# Patient Record
Sex: Female | Born: 1995 | Race: White | Hispanic: No | Marital: Single | Smoking: Never smoker
Health system: Southern US, Community
[De-identification: ages and names within clinical notes are randomized; demographics above are authoritative.]

## PROBLEM LIST (undated history)

## (undated) DIAGNOSIS — D649 Anemia, unspecified: Secondary | ICD-10-CM

## (undated) DIAGNOSIS — O24419 Gestational diabetes mellitus in pregnancy, unspecified control: Secondary | ICD-10-CM

## (undated) HISTORY — DX: Anemia, unspecified: D64.9

## (undated) HISTORY — PX: MOUTH SURGERY: SHX715

## (undated) HISTORY — DX: Gestational diabetes mellitus in pregnancy, unspecified control: O24.419

---
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
## 2017-01-03 ENCOUNTER — Emergency Department (INDEPENDENT_AMBULATORY_CARE_PROVIDER_SITE_OTHER)
Admission: EM | Admit: 2017-01-03 | Discharge: 2017-01-03 | Disposition: A | Discharge status: Home / Self Care | Payer: Medicaid Other | Source: Home / Self Care | Attending: Family Medicine | Admitting: Family Medicine

## 2017-01-03 ENCOUNTER — Encounter: Payer: Self-pay | Admitting: Emergency Medicine

## 2017-01-03 DIAGNOSIS — J029 Acute pharyngitis, unspecified: Secondary | ICD-10-CM | POA: Diagnosis not present

## 2017-01-03 LAB — POCT RAPID STREP A (OFFICE): Rapid Strep A Screen: NEGATIVE

## 2017-01-03 LAB — POCT MONO SCREEN (KUC): Mono, POC: NEGATIVE

## 2017-01-03 MED ORDER — PENICILLIN V POTASSIUM 500 MG PO TABS: ORAL_TABLET | ORAL | 0 refills | Status: DC

## 2017-01-03 NOTE — Discharge Instructions (Signed)
Try warm salt water gargles for sore throat.  ?May take Ibuprofen 200mg, 4 tabs every 8 hours with food.  ?

## 2017-01-03 NOTE — ED Provider Notes (Signed)
[facility]    CSN: [id number] Arrival date & time: 01/03/17  1730     History   Chief Complaint Chief Complaint  Patient presents with  . Sore Throat    HPI Lauren Patel is a 21 y.o. female.   Patient complains of a sore throat for about 5 days without other symptoms.   The history is provided by the patient.    History reviewed. No pertinent past medical history.  There are no active problems to display for this patient.   History reviewed. No pertinent surgical history.  OB History    No data available       Home Medications    Prior to Admission medications   Medication Sig Start Date End Date Taking? Authorizing Provider  Multiple Vitamin (MULTIVITAMIN) tablet Take 1 tablet by mouth daily.    [provider]  penicillin v potassium (VEETID) 500 MG tablet Take one tab by mouth twice daily for 10 days 01/03/17   [name], MD    Family History History reviewed. No pertinent family history.  Social History Social History  Substance Use Topics  . Smoking status: Passive Smoke Exposure - Never Smoker  . Smokeless tobacco: Never Used  . Alcohol use No     Allergies   Patient has no known allergies.   Review of Systems Review of Systems + sore throat + occasional cough No pleuritic pain No wheezing No nasal congestion No post-nasal drainage No sinus pain/pressure No itchy/red eyes No earache No hemoptysis No SOB No fever/chills No nausea No vomiting No abdominal pain No diarrhea No urinary symptoms No skin rash + fatigue No myalgias No headache    Physical Exam Triage Vital Signs ED Triage Vitals  Enc Vitals Group     BP 01/03/17 1748 103/70     Pulse Rate 01/03/17 1748 86     Resp --      Temp 01/03/17 1748 98.2 F (36.8 C)     Temp Source 01/03/17 1748 Oral     SpO2 01/03/17 1748 100 %     Weight 01/03/17 1748 148 lb (67.1 kg)     Height --      Head Circumference --      Peak Flow  --      Pain Score 01/03/17 1749 6     Pain Loc --      Pain Edu? --      Excl. in GC? --    No data found.   Updated Vital Signs BP 103/70 (BP Location: Right Arm)   Pulse 86   Temp 98.2 F (36.8 C) (Oral)   Wt 148 lb (67.1 kg)   SpO2 100%   Visual Acuity Right Eye Distance:   Left Eye Distance:   Bilateral Distance:    Right Eye Near:   Left Eye Near:    Bilateral Near:     Physical Exam Nursing notes and Vital Signs reviewed. Appearance:  Patient appears stated age, and in no acute distress Eyes:  Pupils are equal, round, and reactive to light and accomodation.  Extraocular movement is intact.  Conjunctivae are not inflamed  Ears:  Canals normal.  Tympanic membranes normal.  Nose:  Mildly congested turbinates.  No sinus tenderness.   Pharynx:  Erythematous and slightly swollen without obstruction.  Bilateral exudate present. Neck:  Supple.  Enlarged posterior/lateral nodes are palpated bilaterally, tender to palpation on the left.   Lungs:  Clear to auscultation.  Breath  sounds are equal.  Moving air well. Heart:  Regular rate and rhythm without murmurs, rubs, or gallops.  Abdomen:   Mild tenderness over spleen and liver without masses or hepatosplenomegaly.  Bowel sounds are present.  No CVA or flank tenderness.  Extremities:  No edema.  Skin:  No rash present.    UC Treatments / Results  Labs (all labs ordered are listed, but only abnormal results are displayed) Labs Reviewed  STREP A DNA PROBE  POCT RAPID STREP A (OFFICE) negative  POCT MONO SCREEN ([facility]) negative    EKG  EKG Interpretation None       Radiology No results found.  Procedures Procedures (including critical care time)  Medications Ordered in UC Medications - No data to display   Initial Impression / Assessment and Plan / UC Course  I have reviewed the triage vital signs and the nursing notes.  Pertinent labs & imaging results that were available during my care of the patient were  reviewed by me and considered in my medical decision making (see chart for details).    ?false negative strep. Throat culture pending.  Begin empiric PenVK Try warm salt water gargles for sore throat.  May take Ibuprofen 200mg , 4 tabs every 8 hours with food.  Followup with Family Doctor if not improved in 10 days.    Final Clinical Impressions(s) / UC Diagnoses   Final diagnoses:  Pharyngitis, unspecified etiology    New Prescriptions New Prescriptions   PENICILLIN V POTASSIUM (VEETID) 500 MG TABLET    Take one tab by mouth twice daily for 10 days         [name], [name], MD 01/10/17 1415

## 2017-01-03 NOTE — ED Triage Notes (Signed)
Pt c/o sore throat x4 days. Denies fever or other s/s. She has not taken any meds for this/

## 2017-01-05 ENCOUNTER — Telehealth: Payer: Self-pay | Admitting: Emergency Medicine

## 2017-01-05 LAB — STREP A DNA PROBE: GASP: NOT DETECTED

## 2017-01-05 NOTE — Telephone Encounter (Signed)
Unable to leave a message, strep culture is neg

## 2017-03-05 ENCOUNTER — Encounter: Payer: Self-pay | Admitting: *Deleted

## 2017-03-05 ENCOUNTER — Emergency Department (INDEPENDENT_AMBULATORY_CARE_PROVIDER_SITE_OTHER): Payer: Medicaid Other

## 2017-03-05 ENCOUNTER — Emergency Department (INDEPENDENT_AMBULATORY_CARE_PROVIDER_SITE_OTHER)
Admission: EM | Admit: 2017-03-05 | Discharge: 2017-03-05 | Disposition: A | Discharge status: Home / Self Care | Payer: Medicaid Other | Source: Home / Self Care | Attending: Family Medicine | Admitting: Family Medicine

## 2017-03-05 DIAGNOSIS — M549 Dorsalgia, unspecified: Secondary | ICD-10-CM

## 2017-03-05 DIAGNOSIS — R0602 Shortness of breath: Secondary | ICD-10-CM

## 2017-03-05 DIAGNOSIS — R42 Dizziness and giddiness: Secondary | ICD-10-CM

## 2017-03-05 DIAGNOSIS — R51 Headache: Secondary | ICD-10-CM

## 2017-03-05 DIAGNOSIS — R110 Nausea: Secondary | ICD-10-CM

## 2017-03-05 DIAGNOSIS — R519 Headache, unspecified: Secondary | ICD-10-CM

## 2017-03-05 DIAGNOSIS — R1011 Right upper quadrant pain: Secondary | ICD-10-CM

## 2017-03-05 LAB — POCT CBC W AUTO DIFF (K'VILLE URGENT CARE)

## 2017-03-05 MED ORDER — CYCLOBENZAPRINE HCL 5 MG PO TABS: 5.0000 mg | ORAL_TABLET | Freq: Three times a day (TID) | ORAL | 0 refills | Status: DC | PRN

## 2017-03-05 MED ORDER — ONDANSETRON HCL 4 MG PO TABS: 4.0000 mg | ORAL_TABLET | Freq: Four times a day (QID) | ORAL | 0 refills | Status: DC

## 2017-03-05 NOTE — Discharge Instructions (Signed)
°  You may take 500mg  acetaminophen every 4-6 hours or in combination with ibuprofen 400-600mg  every 6-8 hours as needed for pain, inflammation, and fever.  Be sure to drink at least eight 8oz glasses of water to stay well hydrated and get at least 8 hours of sleep at night, preferably more while sick.   Flexeril (cyclobenzaprine) is a muscle relaxer and may cause drowsiness. Do not drink alcohol, drive, or operate heavy machinery while taking.

## 2017-03-05 NOTE — ED Triage Notes (Signed)
Pt c/o upper abd pain, dizziness, chills, HA and arms feel achy x 3 days. Denies fever, cough or recent URI. Today she reports mid back pain and SOB.

## 2017-03-05 NOTE — ED Notes (Signed)
Blood drawn from the LT antecubital. Attempted 1 stick. Pt tolerated procedure well. Krikor Willet, LPN   

## 2017-03-05 NOTE — ED Provider Notes (Signed)
[facility]    CSN: [id number] Arrival date & time: 03/05/17  1449     History   Chief Complaint Chief Complaint  Patient presents with  . Dizziness  . Generalized Body Aches    HPI Lauren Patel is a 21 y.o. female.   HPI Lauren Patel is a 21 y.o. female presenting to UC with c/o 3 days of intermittent upper abdominal pain, dizziness, chills, generalized HA, aching arms and back.  Today she developed mid back pain and mild SOB.  She has been trying to take warm baths in the evening when back pain and HA seem to get worse, this provides moderate relief.  She has also taken ibuprofen with some relief.  She has had minimal nausea but no vomiting or diarrhea. Denies chest pain, fever, cough, congestion, leg pain or swelling. Denies sick contacts.  She has been able to eat normally and states she did feel somewhat better after eating today. LMP: 02/16/17   History reviewed. No pertinent past medical history.  There are no active problems to display for this patient.   History reviewed. No pertinent surgical history.  OB History    No data available       Home Medications    Prior to Admission medications   Medication Sig Start Date End Date Taking? Authorizing Provider  cyclobenzaprine (FLEXERIL) 5 MG tablet Take 1-2 tablets (5-10 mg total) by mouth 3 (three) times daily as needed for muscle spasms. 03/05/17   Lurene Shadow, PA-C  Multiple Vitamin (MULTIVITAMIN) tablet Take 1 tablet by mouth daily.    [provider]  ondansetron (ZOFRAN) 4 MG tablet Take 1 tablet (4 mg total) by mouth every 6 (six) hours. 03/05/17   Lurene Shadow, PA-C    Family History History reviewed. No pertinent family history.  Social History Social History  Substance Use Topics  . Smoking status: Passive Smoke Exposure - Never Smoker  . Smokeless tobacco: Never Used  . Alcohol use No     Allergies   Patient has no known allergies.   Review of  Systems Review of Systems  Constitutional: Positive for chills. Negative for appetite change, fatigue and fever.  HENT: Negative for congestion, ear pain, hearing loss and sore throat.   Eyes: Negative for photophobia and pain.  Respiratory: Negative for cough and shortness of breath.   Cardiovascular: Negative for chest pain and palpitations.  Gastrointestinal: Positive for abdominal pain (upper) and nausea. Negative for diarrhea and vomiting.  Genitourinary: Negative for dysuria, frequency, hematuria and urgency.  Musculoskeletal: Positive for back pain and myalgias. Negative for arthralgias, neck pain and neck stiffness.  Skin: Negative for rash.  Neurological: Positive for dizziness and headaches. Negative for syncope, weakness, light-headedness and numbness.     Physical Exam Triage Vital Signs ED Triage Vitals [03/05/17 1514]  Enc Vitals Group     BP 119/78     Pulse Rate 86     Resp 16     Temp 98 F (36.7 C)     Temp Source Oral     SpO2 100 %     Weight 147 lb (66.7 kg)     Height 5\' 7"  (1.702 m)     Head Circumference      Peak Flow      Pain Score 0     Pain Loc      Pain Edu?      Excl. in GC?    Orthostatic VS for  the past 24 hrs:  BP- Lying Pulse- Lying BP- Sitting Pulse- Sitting BP- Standing at 0 minutes Pulse- Standing at 0 minutes  03/05/17 1602 107/73 78 106/71 85 118/80 90    Updated Vital Signs BP 119/78 (BP Location: Left Arm)   Pulse 86   Temp 98 F (36.7 C) (Oral)   Resp 16   Ht 5\' 7"  (1.702 m)   Wt 147 lb (66.7 kg)   LMP 02/16/2017   SpO2 100%   BMI 23.02 kg/m     Physical Exam  Constitutional: She is oriented to person, place, and time. She appears well-developed and well-nourished. No distress.  HENT:  Head: Normocephalic and atraumatic.  Right Ear: Tympanic membrane normal.  Left Ear: Tympanic membrane normal.  Nose: Nose normal.  Mouth/Throat: Uvula is midline, oropharynx is clear and moist and mucous membranes are normal.    Eyes: Pupils are equal, round, and reactive to light. Conjunctivae and EOM are normal. Right eye exhibits no discharge. Left eye exhibits no discharge.  Neck: Normal range of motion. Neck supple.  Cardiovascular: Normal rate and regular rhythm.   Pulmonary/Chest: Effort normal and breath sounds normal. No respiratory distress. She has no wheezes. She has no rales.  Abdominal: Soft. Bowel sounds are normal. She exhibits no distension and no mass. There is tenderness. There is no rebound, no guarding and no CVA tenderness.  Mild tenderness across upper abdomen. No focal tenderness. Soft, non-distended.   Musculoskeletal: Normal range of motion. She exhibits no edema or tenderness.  Neurological: She is alert and oriented to person, place, and time. No cranial nerve deficit.  CN II-XII in tact. Speech is clear, alert to person place and time. Normal gait.   Skin: Skin is warm and dry. She is not diaphoretic.  Psychiatric: She has a normal mood and affect. Her behavior is normal.  Nursing note and vitals reviewed.    UC Treatments / Results  Labs (all labs ordered are listed, but only abnormal results are displayed) Labs Reviewed  COMPLETE METABOLIC PANEL WITH GFR  POCT CBC W AUTO DIFF ([facility])    EKG  EKG Interpretation None       Radiology Dg Chest 2 View  Result Date: 03/05/2017 CLINICAL DATA:  21 year old female with shortness of breath for 4 days. EXAM: CHEST  2 VIEW COMPARISON:  None. FINDINGS: Normal lung volumes. Normal cardiac size and mediastinal contours. Visualized tracheal air column is within normal limits. The lungs are clear. No pneumothorax or pleural effusion. No osseous abnormality identified. Negative visible bowel gas pattern. IMPRESSION: Negative.  No acute cardiopulmonary abnormality. Electronically Signed   By: Odessa FlemingH  Hall M.D.   On: 03/05/2017 15:51    Procedures Procedures (including critical care time)  Medications Ordered in UC Medications -  No data to display   Initial Impression / Assessment and Plan / UC Course  I have reviewed the triage vital signs and the nursing notes.  Pertinent labs & imaging results that were available during my care of the patient were reviewed by me and considered in my medical decision making (see chart for details).     Pt presenting with multiple vague complaints. Pt overall appears well and vitals: WNL  CBC: WNL CMP: Pending Pt denied concern for pregnancy. LMP: 02/16/17 Question if back pain and HA related to muscle spasms/cramps. May try flexeril. Discussed possibility of fatigue when taking. Take at night. No alcohol or driving while taking. Abdominal pain- gallbladder? CMP pending  Doubt PE-  O2 Sat 100% on RA. No evidence of respiratory distress.   Encouraged fluids and rest. May have acetaminophen and ibuprofen for pain F/u with PCP in 2-3 days if not improving Discussed symptoms that warrant emergent care in the ED.   Final Clinical Impressions(s) / UC Diagnoses   Final diagnoses:  Dizziness  Nausea without vomiting  Abdominal pain, right upper quadrant  Mid back pain on right side  Generalized headache  Shortness of breath    New Prescriptions Discharge Medication List as of 03/05/2017  3:59 PM    START taking these medications   Details  cyclobenzaprine (FLEXERIL) 5 MG tablet Take 1-2 tablets (5-10 mg total) by mouth 3 (three) times daily as needed for muscle spasms., Starting Fri 03/05/2017, Normal         Controlled Substance Prescriptions [place] Controlled Substance Registry consulted? Not Applicable   Rolla Plate 03/05/17 1652

## 2017-03-06 ENCOUNTER — Telehealth: Payer: Self-pay | Admitting: Emergency Medicine

## 2017-03-06 LAB — COMPLETE METABOLIC PANEL WITH GFR
AG Ratio: 1.5 (calc) (ref 1.0–2.5)
ALT: 7 U/L (ref 5–32)
AST: 17 U/L (ref 12–32)
Albumin: 4.1 g/dL (ref 3.6–5.1)
Alkaline phosphatase (APISO): 62 U/L (ref 47–176)
BUN: 12 mg/dL (ref 7–20)
CO2: 27 mmol/L (ref 20–32)
Calcium: 8.9 mg/dL (ref 8.9–10.4)
Chloride: 102 mmol/L (ref 98–110)
Creat: 0.82 mg/dL (ref 0.50–1.00)
GFR, Est African American: 120 mL/min/{1.73_m2} (ref >=60)
GFR, Est Non African American: 104 mL/min/{1.73_m2} (ref >=60)
Globulin: 2.8 g/dL (calc) (ref 2.0–3.8)
Glucose, Bld: 96 mg/dL (ref 65–99)
Potassium: 4 mmol/L (ref 3.8–5.1)
Sodium: 138 mmol/L (ref 135–146)
Total Bilirubin: 0.3 mg/dL (ref 0.2–1.1)
Total Protein: 6.9 g/dL (ref 6.3–8.2)

## 2017-05-19 ENCOUNTER — Other Ambulatory Visit: Payer: Self-pay

## 2017-05-19 ENCOUNTER — Encounter (HOSPITAL_BASED_OUTPATIENT_CLINIC_OR_DEPARTMENT_OTHER): Payer: Self-pay

## 2017-05-19 ENCOUNTER — Emergency Department (HOSPITAL_BASED_OUTPATIENT_CLINIC_OR_DEPARTMENT_OTHER)
Admission: EM | Admit: 2017-05-19 | Discharge: 2017-05-19 | Disposition: A | Discharge status: Home / Self Care | Payer: Self-pay | Attending: Emergency Medicine | Admitting: Emergency Medicine

## 2017-05-19 DIAGNOSIS — Z79899 Other long term (current) drug therapy: Secondary | ICD-10-CM | POA: Insufficient documentation

## 2017-05-19 DIAGNOSIS — Z7722 Contact with and (suspected) exposure to environmental tobacco smoke (acute) (chronic): Secondary | ICD-10-CM | POA: Insufficient documentation

## 2017-05-19 DIAGNOSIS — R040 Epistaxis: Secondary | ICD-10-CM | POA: Insufficient documentation

## 2017-05-19 MED ORDER — OXYMETAZOLINE HCL 0.05 % NA SOLN
1.0000 | Freq: Once | NASAL | Status: AC
  Administered 2017-05-19: 1 via NASAL
  Filled 2017-05-19: qty 15

## 2017-05-19 NOTE — ED Provider Notes (Signed)
[facility] EMERGENCY DEPARTMENT Provider Note   CSN: [id number] Arrival date & time: 05/19/17  1530     History   Chief Complaint Chief Complaint  Patient presents with  . Epistaxis    HPI Lauren Patel is a 22 y.o. female.  The history is provided by the patient and medical records. No language interpreter was used.  Epistaxis     Lauren Patel is an otherwise healthy 22 y.o. female who presents to the Emergency Department complaining of nosebleed which began around 45 minutes ago. Bleeding was just from the left nare. Lasted for about 45 minutes and has now resolved. No history of similar. Has had nasal congestion, but has not felt ill. She has had a headache and right-sided neck pain for the last two days, but this morning, her headache had actually resolved. She took ibuprofen which helped. Hx of similar headaches which typically do improve in a day or two after ibuprofen. No fever, chills, weakness, numbness, visual changes. Neck does not feel stiff.   History reviewed. No pertinent past medical history.  There are no active problems to display for this patient.   History reviewed. No pertinent surgical history.  OB History    No data available       Home Medications    Prior to Admission medications   Medication Sig Start Date End Date Taking? Authorizing Provider  cyclobenzaprine (FLEXERIL) 5 MG tablet Take 1-2 tablets (5-10 mg total) by mouth 3 (three) times daily as needed for muscle spasms. 03/05/17  Yes Phelps, Vangie Bicker, PA-C  Multiple Vitamin (MULTIVITAMIN) tablet Take 1 tablet by mouth daily.    [provider]  ondansetron (ZOFRAN) 4 MG tablet Take 1 tablet (4 mg total) by mouth every 6 (six) hours. 03/05/17   Lurene Shadow, PA-C    Family History No family history on file.  Social History Social History   Tobacco Use  . Smoking status: Passive Smoke Exposure - Never Smoker  . Smokeless tobacco: Never Used    Substance Use Topics  . Alcohol use: No  . Drug use: No     Allergies   Patient has no known allergies.   Review of Systems Review of Systems  HENT: Positive for nosebleeds.   Musculoskeletal: Positive for neck pain. Negative for back pain.  Neurological: Positive for headaches (Resolved). Negative for dizziness, weakness and numbness.     Physical Exam Updated Vital Signs BP 115/76 (BP Location: Right Arm)   Pulse (!) 57   Temp 98.4 F (36.9 C) (Oral)   Resp 18   Ht 5\' 7"  (1.702 m)   Wt 63.5 kg (140 lb)   LMP 05/16/2017   SpO2 100%   BMI 21.93 kg/m   Physical Exam  Constitutional: She is oriented to person, place, and time. She appears well-developed and well-nourished. No distress.  HENT:  Head: Normocephalic and atraumatic.  Right nare normal. Dried blood in anterior left nare.  Neck:  Full ROM without pain.  No meningeal signs.  No midline tenderness, but does have tenderness to the right paraspinal musculature.  Cardiovascular: Normal rate, regular rhythm and normal heart sounds.  No murmur heard. Pulmonary/Chest: Effort normal and breath sounds normal. No respiratory distress.  Abdominal: Soft. She exhibits no distension. There is no tenderness.  Musculoskeletal: Normal range of motion.  Neurological: She is alert and oriented to person, place, and time.  Speech clear and goal oriented. CN 2-12 grossly intact. Normal finger-to-nose and rapid  alternating movements. No drift. Strength and sensation intact. Steady gait.   Skin: Skin is warm and dry.  Nursing note and vitals reviewed.    ED Treatments / Results  Labs (all labs ordered are listed, but only abnormal results are displayed) Labs Reviewed - No data to display  EKG  EKG Interpretation None       Radiology No results found.  Procedures Procedures (including critical care time)  Medications Ordered in ED Medications  oxymetazoline (AFRIN) 0.05 % nasal spray 1 spray (1 spray Each Nare  Given 05/19/17 1718)     Initial Impression / Assessment and Plan / ED Course  I have reviewed the triage vital signs and the nursing notes.  Pertinent labs & imaging results that were available during my care of the patient were reviewed by me and considered in my medical decision making (see chart for details).    Lauren Patel is an otherwise healthy 22 y.o. female who presents to ED for nosebleed which began today, lasting 45 minutes, and has now resolved. No active bleeding on exam, but does have dried blood in the nare. Afrin given. BP 115/76. Has had a headache over the last 2 days, but no headache today. No focal neuro deficits. Hx of similar headaches. Some right-sided neck pain which appears musk on exam. No fevers. No meningeal signs on exam.   Evaluation does not show pathology that would require ongoing emergent intervention or inpatient treatment.  Symptomatic home care instructions discussed.  Reasons to return to ER discussed.  All questions answered.  Final Clinical Impressions(s) / ED Diagnoses   Final diagnoses:  Epistaxis    ED Discharge Orders    None       Kieron Kantner, Chase PicketJaime Pilcher, PA-C 05/19/17 1728    Loren RacerYelverton, David, MD 05/21/17 [phone]

## 2017-05-19 NOTE — ED Triage Notes (Addendum)
Pt c/o headache for two days that has resolved, muscular neck pain that started yesterday and a nosebleed for 45 minutes today, pt has not taken any meds for symptoms today

## 2017-05-19 NOTE — ED Notes (Signed)
Nurse first-pt with tissue in left nostril-no nose bleed through at this time-NAD

## 2017-05-19 NOTE — Discharge Instructions (Signed)
It was my pleasure taking care of you today!   Use afrin tonight and in the morning to prevent nose from starting to bleed again.  If your nose starts bleeding again, hold direct pressure as we discussed for 20 minutes without stopping. If nose continues to bleed, return to ER.   Ibuprofen as needed every 8 hours for headache / neck pain. You can take up to 600mg  each dose (3 pills up to 3 times a day). Heat or ice to the neck can help with pain relief as well. Stay hydrated!  Fortunately, your evaluation today is reassuring with no apparent emergent cause for your headache at this time. With that being said, it is VERY important that you monitor your symptoms at home. If you develop worsening headache, new fever, rash, weakness, numbness, trouble with your speech, trouble walking, new or worsening symptoms or any concerning symptoms, please return to the ED immediately.

## 2018-02-21 ENCOUNTER — Ambulatory Visit (INDEPENDENT_AMBULATORY_CARE_PROVIDER_SITE_OTHER): Payer: Self-pay | Admitting: *Deleted

## 2018-02-21 ENCOUNTER — Encounter: Payer: Self-pay | Admitting: Obstetrics & Gynecology

## 2018-02-21 ENCOUNTER — Telehealth: Payer: Self-pay | Admitting: *Deleted

## 2018-02-21 ENCOUNTER — Encounter: Payer: Self-pay | Admitting: *Deleted

## 2018-02-21 VITALS — BP 103/68 | HR 92 | Wt 135.6 lb

## 2018-02-21 DIAGNOSIS — Z3A10 10 weeks gestation of pregnancy: Secondary | ICD-10-CM

## 2018-02-21 DIAGNOSIS — Z3201 Encounter for pregnancy test, result positive: Secondary | ICD-10-CM

## 2018-02-21 LAB — POCT PREGNANCY, URINE: Preg Test, Ur: POSITIVE — AB

## 2018-02-21 NOTE — Progress Notes (Signed)
Pt here to confirm pregnancy. Denies any pain or bleeding. Some nausea in am and tired. Encouraged to eat small frequent meals. May try OTC B6 and Unisom for nausea. To make appt with provider of choice to begin [facility]. Pt voices understanding and agrees with plan

## 2018-02-21 NOTE — Telephone Encounter (Signed)
Called patient to schedule New OB appt. But no answer and mailbox is full.

## 2018-02-21 NOTE — Progress Notes (Signed)
I have reviewed the chart and agree with nursing staff's documentation of this patient's encounter.  Thressa Sheller, CNM 02/21/2018 3:49 PM

## 2018-05-02 ENCOUNTER — Encounter: Payer: Self-pay | Admitting: Obstetrics & Gynecology

## 2018-05-04 NOTE — L&D Delivery Note (Signed)
OB/GYN Faculty Practice Delivery Note  Lauren Patel is a 23 y.o. G2P1001 s/p SVD at [redacted]w[redacted]d. She was admitted for spontaneous onset of labor.   ROM: 5h 40m with clear fluid GBS Status: negative Maximum Maternal Temperature: Temp (24hrs), Avg:98.5 F (36.9 C), Min:98.4 F (36.9 C), Max:98.5 F (36.9 C)  Labor Progress: . Admitted in active labor . Epidural placement . SROM  . Progressed to complete  Delivery Date/Time: 09/17/18 at 0156 Delivery: Called to room and patient was complete and pushing. Head delivered LOA. No nuchal cord present. Shoulder and body delivered in usual fashion. Infant with spontaneous cry, placed on mother's abdomen, dried and stimulated. Cord clamped x 2 after 1-minute delay, and cut by father of baby. Cord blood drawn. Placenta delivered spontaneously with gentle cord traction. Fundus firm with massage and Pitocin. Labia, perineum, vagina, and cervix inspected inspected with no lacerations.   Placenta: spontaneous, intact, 3-vessel cord (to be discarded) Complications: none immediate Lacerations: none EBL: 200, Triton value pending in delivery summary Analgesia: epidural  Postpartum Planning [x]  message to sent to schedule follow-up  [x]  vaccines UTD  Infant: Vigorous female  APGARs 8, 9  weight pending but appears AGA  Laurel S. Earlene Plater, DO OB/GYN Fellow, Faculty Practice

## 2018-05-18 ENCOUNTER — Other Ambulatory Visit (HOSPITAL_COMMUNITY)
Admission: RE | Admit: 2018-05-18 | Discharge: 2018-05-18 | Disposition: A | Discharge status: Home / Self Care | Payer: Medicaid Other | Source: Ambulatory Visit | Attending: Obstetrics and Gynecology | Admitting: Obstetrics and Gynecology

## 2018-05-18 ENCOUNTER — Ambulatory Visit (INDEPENDENT_AMBULATORY_CARE_PROVIDER_SITE_OTHER): Payer: Medicaid Other | Admitting: Obstetrics and Gynecology

## 2018-05-18 ENCOUNTER — Encounter: Payer: Self-pay | Admitting: Obstetrics and Gynecology

## 2018-05-18 VITALS — BP 98/59 | HR 77 | Wt 145.0 lb

## 2018-05-18 DIAGNOSIS — Z23 Encounter for immunization: Secondary | ICD-10-CM | POA: Diagnosis not present

## 2018-05-18 DIAGNOSIS — Z3482 Encounter for supervision of other normal pregnancy, second trimester: Secondary | ICD-10-CM | POA: Diagnosis not present

## 2018-05-18 DIAGNOSIS — O0930 Supervision of pregnancy with insufficient antenatal care, unspecified trimester: Secondary | ICD-10-CM

## 2018-05-18 DIAGNOSIS — O0932 Supervision of pregnancy with insufficient antenatal care, second trimester: Secondary | ICD-10-CM

## 2018-05-18 DIAGNOSIS — Z8632 Personal history of gestational diabetes: Secondary | ICD-10-CM

## 2018-05-18 DIAGNOSIS — Z3480 Encounter for supervision of other normal pregnancy, unspecified trimester: Secondary | ICD-10-CM

## 2018-05-18 MED ORDER — ASPIRIN EC 81 MG PO TBEC: 81.0000 mg | DELAYED_RELEASE_TABLET | Freq: Every day | ORAL | 1 refills | Status: DC

## 2018-05-18 NOTE — Patient Instructions (Signed)

## 2018-05-18 NOTE — Progress Notes (Signed)
Subjective:   Lauren Patel is a 23 y.o. G2P1000 at [redacted]w[redacted]d by LMP being seen today for her first obstetrical visit.  Her obstetrical history is significant for gestational DM , late to care, traumatic birth experience with previous delivery. PP hemorrhage requiring blood transfusion and vacuum delivery.  Patient does not intend to breast feed. Pregnancy history fully reviewed. Unplanned pregnancy however happy. Significant other present today for visit.   Patient reports no complaints.  HISTORY: OB History  Gravida Para Term Preterm AB Living  2 1 1  0 0 0  SAB TAB Ectopic Multiple Live Births  0 0 0 0 1    # Outcome Date GA Lbr Len/2nd Weight Sex Delivery Anes PTL Lv  2 Current           1 Term 05/06/16     Vag-Spont       Last pap smear was done NA, age 39.   Past Medical History:  Diagnosis Date  . Anemia   . Gestational diabetes    Past Surgical History:  Procedure Laterality Date  . MOUTH SURGERY     Family History  Problem Relation Age of Onset  . Asthma Mother   . Diabetes Maternal Grandmother   . Breast cancer Maternal Grandmother   . Hypertension Maternal Grandmother   . Asthma Maternal Grandmother   . Breast cancer Paternal Grandmother    Social History   Tobacco Use  . Smoking status: Passive Smoke Exposure - Never Smoker  . Smokeless tobacco: Never Used  Substance Use Topics  . Alcohol use: No  . Drug use: No   No Known Allergies Current Outpatient Medications on File Prior to Visit  Medication Sig Dispense Refill  . ondansetron (ZOFRAN) 4 MG tablet Take 1 tablet (4 mg total) by mouth every 6 (six) hours. (Patient not taking: Reported on 02/21/2018) 12 tablet 0   No current facility-administered medications on file prior to visit.     Review of Systems Pertinent items noted in HPI and remainder of comprehensive ROS otherwise negative.  Exam   Vitals:   05/18/18 1404  BP: (!) 98/59  Pulse: 77  Weight: 145 lb (65.8 kg)     BP  (!) 98/59   Pulse 77   Wt 145 lb (65.8 kg)   LMP 12/11/2017   BMI 22.71 kg/m  Uterine Size: size equals dates  Pelvic Exam: Deferred    Skin: normal coloration and turgor, no rashes    Neurologic: negative   Extremities: normal strength, tone, and muscle mass   HEENT neck supple with midline trachea and thyroid without masses   Mouth/Teeth mucous membranes moist, pharynx normal without lesions   Neck supple and no masses   Cardiovascular: regular rate and rhythm, no murmurs or gallops   Respiratory:  appears well, vitals normal, no respiratory distress, acyanotic, normal RR, neck free of mass or lymphadenopathy, chest clear, no wheezing, crepitations, rhonchi, normal symmetric air entry   Abdomen: soft, non-tender; bowel sounds normal; no masses,  no organomegaly    Assessment:   Pregnancy: G2P1000 Patient Active Problem List   Diagnosis Date Noted  . Late prenatal care 05/19/2018  . Traumatic birth 05/19/2018  . Supervision of other normal pregnancy, antepartum 05/18/2018  . Hx gestational diabetes 05/18/2018     Plan:   1. Late prenatal care  - Culture, OB Urine - Obstetric panel - HIV antibody (with reflex) - Urine cytology ancillary only([place]) - Genetic Screening - HgB  A1c - Alpha fetoprotein, maternal - CHL AMB BABYSCRIPTS OPT IN - Cystic fibrosis diagnostic study - SMN1 Copy Number Analysis  2. Supervision of other normal pregnancy, antepartum  - [place] MFM OB COMP + 14 WK; Future  3. Hx gestational diabetes  - A1C today  - Start BASA today RX sent   4. Traumatic birth: [place]   - post partum hemorrhage.  - vacuum delivery with hematoma of baby  - patient and significant other with a lot of anxiety discussing delivery. Discussed    C-section as an option. They are undecided at this time   Initial labs drawn. Continue prenatal vitamins. Genetic Screening discussed, NIPS: requested. Ultrasound discussed; fetal anatomic survey:  ordered. Problem list reviewed and updated. The nature of [facility] with multiple MDs and other Advanced Practice Providers was explained to patient; also emphasized that residents, students are part of our team. Routine obstetric precautions reviewed. No follow-ups on file.    Rasch, Harolyn Rutherford, NP Faculty Practice Center for [employer], [facility]

## 2018-05-19 DIAGNOSIS — O0930 Supervision of pregnancy with insufficient antenatal care, unspecified trimester: Secondary | ICD-10-CM | POA: Insufficient documentation

## 2018-05-19 LAB — ALPHA FETOPROTEIN, MATERNAL
AFP MOM: 0.98
AFP, Serum: 75.9 ng/mL
Calc'd Gestational Age: 22.3 weeks
MATERNAL WT: 148 [lb_av]
Risk for ONTD: 1
Twins-AFP: 1

## 2018-05-19 LAB — OBSTETRIC PANEL
ANTIBODY SCREEN: NOT DETECTED
Absolute Monocytes: 479 cells/uL (ref 200–950)
BASOS PCT: 0.3 %
Basophils Absolute: 38 cells/uL (ref 0–200)
Eosinophils Absolute: 38 cells/uL (ref 15–500)
Eosinophils Relative: 0.3 %
HCT: 32 % — ABNORMAL LOW (ref 35.0–45.0)
Hemoglobin: 10.7 g/dL — ABNORMAL LOW (ref 11.7–15.5)
Hepatitis B Surface Ag: NONREACTIVE
Lymphs Abs: 2230 cells/uL (ref 850–3900)
MCH: 27.5 pg (ref 27.0–33.0)
MCHC: 33.4 g/dL (ref 32.0–36.0)
MCV: 82.3 fL (ref 80.0–100.0)
MPV: 11 fL (ref 7.5–12.5)
Monocytes Relative: 3.8 %
Neutro Abs: 9815 cells/uL — ABNORMAL HIGH (ref 1500–7800)
Neutrophils Relative %: 77.9 %
Platelets: 236 10*3/uL (ref 140–400)
RBC: 3.89 10*6/uL (ref 3.80–5.10)
RDW: 13.7 % (ref 11.0–15.0)
RPR Ser Ql: NONREACTIVE
Rubella: 2.15 index
Total Lymphocyte: 17.7 %
WBC: 12.6 10*3/uL — ABNORMAL HIGH (ref 3.8–10.8)

## 2018-05-19 LAB — URINE CYTOLOGY ANCILLARY ONLY
Chlamydia: NEGATIVE
Neisseria Gonorrhea: NEGATIVE

## 2018-05-19 LAB — HEMOGLOBIN A1C
Hgb A1c MFr Bld: 5.2 % of total Hgb (ref <5.7)
Mean Plasma Glucose: 103 (calc)
eAG (mmol/L): 5.7 (calc)

## 2018-05-19 LAB — HIV ANTIBODY (ROUTINE TESTING W REFLEX): HIV: NONREACTIVE

## 2018-05-20 LAB — SMN1 COPY NUMBER ANALYSIS (SMA CARRIER SCREENING)

## 2018-05-20 LAB — CULTURE, OB URINE

## 2018-05-20 LAB — URINE CULTURE, OB REFLEX: ORGANISM ID, BACTERIA: NO GROWTH

## 2018-05-23 ENCOUNTER — Ambulatory Visit (HOSPITAL_COMMUNITY)
Admission: RE | Admit: 2018-05-23 | Discharge: 2018-05-23 | Disposition: A | Discharge status: Home / Self Care | Payer: Medicaid Other | Source: Ambulatory Visit | Attending: Obstetrics and Gynecology | Admitting: Obstetrics and Gynecology

## 2018-05-23 DIAGNOSIS — Z3480 Encounter for supervision of other normal pregnancy, unspecified trimester: Secondary | ICD-10-CM | POA: Insufficient documentation

## 2018-05-23 DIAGNOSIS — O0932 Supervision of pregnancy with insufficient antenatal care, second trimester: Secondary | ICD-10-CM

## 2018-05-23 DIAGNOSIS — Z363 Encounter for antenatal screening for malformations: Secondary | ICD-10-CM | POA: Diagnosis not present

## 2018-05-23 DIAGNOSIS — Z3A23 23 weeks gestation of pregnancy: Secondary | ICD-10-CM

## 2018-05-26 ENCOUNTER — Telehealth: Payer: Self-pay

## 2018-05-26 DIAGNOSIS — Z3480 Encounter for supervision of other normal pregnancy, unspecified trimester: Secondary | ICD-10-CM

## 2018-05-26 LAB — CYSTIC FIBROSIS DIAGNOSTIC STUDY

## 2018-05-26 NOTE — Telephone Encounter (Signed)
Attempted to call pt with negative [employer] results but, pt did not answer and voicemail box was full.

## 2018-06-15 ENCOUNTER — Encounter: Payer: Medicaid Other | Admitting: Obstetrics and Gynecology

## 2018-06-20 ENCOUNTER — Ambulatory Visit (INDEPENDENT_AMBULATORY_CARE_PROVIDER_SITE_OTHER): Payer: Medicaid Other | Admitting: Obstetrics & Gynecology

## 2018-06-20 VITALS — BP 91/57 | HR 62 | Wt 156.0 lb

## 2018-06-20 DIAGNOSIS — Z3480 Encounter for supervision of other normal pregnancy, unspecified trimester: Secondary | ICD-10-CM

## 2018-06-20 DIAGNOSIS — Z3A27 27 weeks gestation of pregnancy: Secondary | ICD-10-CM

## 2018-06-20 DIAGNOSIS — Z23 Encounter for immunization: Secondary | ICD-10-CM | POA: Diagnosis not present

## 2018-06-20 DIAGNOSIS — Z3482 Encounter for supervision of other normal pregnancy, second trimester: Secondary | ICD-10-CM

## 2018-06-20 DIAGNOSIS — M545 Low back pain, unspecified: Secondary | ICD-10-CM

## 2018-06-20 LAB — POCT URINALYSIS DIPSTICK
Bilirubin, UA: NEGATIVE
Blood, UA: NEGATIVE
Glucose, UA: NEGATIVE
Ketones, UA: NEGATIVE
Leukocytes, UA: NEGATIVE
NITRITE UA: NEGATIVE
Protein, UA: NEGATIVE
Spec Grav, UA: 1.01 (ref 1.010–1.025)
Urobilinogen, UA: NEGATIVE E.U./dL — AB
pH, UA: 7.5 (ref 5.0–8.0)

## 2018-06-20 NOTE — Progress Notes (Signed)
Lower back and lower pelvic pain on Friday/Sat    PRENATAL VISIT NOTE  Subjective:  Lauren Patel is a 23 y.o. G2P1000 at [redacted]w[redacted]d being seen today for ongoing prenatal care.  She is currently monitored for the following issues for this low-risk pregnancy and has Supervision of other normal pregnancy, antepartum; Hx gestational diabetes; Late prenatal care; and Traumatic birth on their problem list.  Patient reports back and pelvic pain.  Contractions: Not present. Vag. Bleeding: None.  Movement: Present. Denies leaking of fluid.   The following portions of the patient's history were reviewed and updated as appropriate: allergies, current medications, past family history, past medical history, past social history, past surgical history and problem list. Problem list updated.  Objective:   Vitals:   06/20/18 0833  BP: (!) 91/57  Pulse: 62  Weight: 156 lb (70.8 kg)    Fetal Status: Fetal Heart Rate (bpm): 134   Movement: Present     General:  Alert, oriented and cooperative. Patient is in no acute distress.  Skin: Skin is warm and dry. No rash noted.   Cardiovascular: Normal heart rate noted  Respiratory: Normal respiratory effort, no problems with respiration noted  Abdomen: Soft, gravid, appropriate for gestational age.  Pain/Pressure: Present     Pelvic: Cervical exam performed      closed, posterior,   Extremities: Normal range of motion.  Edema: None  Mental Status: Normal mood and affect. Normal behavior. Normal judgment and thought content.   Assessment and Plan:  Pregnancy: G2P1000 at [redacted]w[redacted]d with back cramping and lower pelvic cramping Friday and Saturday.  Resolved now.  Cervical exam as above--non labored, posterior cervix.    1. Supervision of other normal pregnancy, antepartum - 2Hr GTT w/ 1 Hr Carpenter 75 g - CBC - HIV antibody (with reflex) - RPR  2. Bilateral low back pain without sciatica, unspecified chronicity - POCT Urinalysis Dipstick  Preterm  labor symptoms and general obstetric precautions including but not limited to vaginal bleeding, contractions, leaking of fluid and fetal movement were reviewed in detail with the patient. Please refer to After Visit Summary for other counseling recommendations.  Return in about 2 weeks (around 07/04/2018).   Elsie Lincoln, MD

## 2018-06-23 LAB — 2HR GTT W 1 HR, CARPENTER, 75 G
Glucose, 1 Hr, Gest: 136 mg/dL (ref 65–179)
Glucose, 2 Hr, Gest: 92 mg/dL (ref 65–152)
Glucose, Fasting, Gest: 76 mg/dL (ref 65–91)

## 2018-06-23 LAB — CBC
HEMATOCRIT: 29.6 % — AB (ref 35.0–45.0)
Hemoglobin: 10.1 g/dL — ABNORMAL LOW (ref 11.7–15.5)
MCH: 27.8 pg (ref 27.0–33.0)
MCHC: 34.1 g/dL (ref 32.0–36.0)
MCV: 81.5 fL (ref 80.0–100.0)
MPV: 11.2 fL (ref 7.5–12.5)
Platelets: 195 10*3/uL (ref 140–400)
RBC: 3.63 10*6/uL — ABNORMAL LOW (ref 3.80–5.10)
RDW: 12.7 % (ref 11.0–15.0)
WBC: 8.2 10*3/uL (ref 3.8–10.8)

## 2018-06-23 LAB — HIV ANTIBODY (ROUTINE TESTING W REFLEX): HIV: NONREACTIVE

## 2018-06-23 LAB — FERRITIN: Ferritin: 16 ng/mL (ref 16–154)

## 2018-06-23 LAB — RPR: RPR Ser Ql: NONREACTIVE

## 2018-06-23 LAB — TEST AUTHORIZATION

## 2018-06-24 ENCOUNTER — Encounter: Payer: Self-pay | Admitting: Obstetrics & Gynecology

## 2018-06-24 DIAGNOSIS — D649 Anemia, unspecified: Secondary | ICD-10-CM | POA: Insufficient documentation

## 2018-06-24 DIAGNOSIS — O99019 Anemia complicating pregnancy, unspecified trimester: Secondary | ICD-10-CM | POA: Insufficient documentation

## 2018-06-28 ENCOUNTER — Telehealth: Payer: Self-pay | Admitting: *Deleted

## 2018-06-28 NOTE — Telephone Encounter (Signed)
-----   Message from Lauren Dukes, MD sent at 06/24/2018  6:55 AM EST ----- Patient is anemic and need ferrous sulfate 325 mg bid with colace to prevent constipation.  RN to call.

## 2018-06-28 NOTE — Telephone Encounter (Signed)
LM on voicemail regarding her anemia and suggested that she start FeS04 325 mg and Colace daily to build Hgb up.

## 2018-07-04 ENCOUNTER — Ambulatory Visit (INDEPENDENT_AMBULATORY_CARE_PROVIDER_SITE_OTHER): Payer: Medicaid Other | Admitting: Obstetrics & Gynecology

## 2018-07-04 DIAGNOSIS — Z3483 Encounter for supervision of other normal pregnancy, third trimester: Secondary | ICD-10-CM

## 2018-07-04 DIAGNOSIS — Z3480 Encounter for supervision of other normal pregnancy, unspecified trimester: Secondary | ICD-10-CM

## 2018-07-04 DIAGNOSIS — Z3A29 29 weeks gestation of pregnancy: Secondary | ICD-10-CM

## 2018-07-04 NOTE — Progress Notes (Signed)
   PRENATAL VISIT NOTE  Subjective:  Lauren Patel is a 23 y.o. G2P1000 at [redacted]w[redacted]d being seen today for ongoing prenatal care.  She is currently monitored for the following issues for this low-risk pregnancy and has Supervision of other normal pregnancy, antepartum; Hx gestational diabetes; Late prenatal care; Traumatic birth; and Anemia during pregnancy on their problem list.  Patient reports mild SOB with exertion.  Contractions: Not present. Vag. Bleeding: None.  Movement: Present. Denies leaking of fluid.   The following portions of the patient's history were reviewed and updated as appropriate: allergies, current medications, past family history, past medical history, past social history, past surgical history and problem list. Problem list updated.  Objective:   Vitals:   07/04/18 0833  BP: (!) 96/59  Pulse: 98  Weight: 162 lb (73.5 kg)    Fetal Status: Fetal Heart Rate (bpm): 150 Fundal Height: 29 cm Movement: Present     General:  Alert, oriented and cooperative. Patient is in no acute distress.  Skin: Skin is warm and dry. No rash noted.   Cardiovascular: Normal heart rate noted  Respiratory: Normal respiratory effort, no problems with respiration noted  Abdomen: Soft, gravid, appropriate for gestational age.  Pain/Pressure: Absent     Pelvic: Cervical exam deferred        Extremities: Normal range of motion.  Edema: None  Mental Status: Normal mood and affect. Normal behavior. Normal judgment and thought content.   Assessment and Plan:  Pregnancy: G2P1000 at [redacted]w[redacted]d  1. Supervision of other normal pregnancy, antepartum SOB is minor with exercise.  Pt denies lightheartedness and dizziness and syncope.  Preterm labor symptoms and general obstetric precautions including but not limited to vaginal bleeding, contractions, leaking of fluid and fetal movement were reviewed in detail with the patient. Please refer to After Visit Summary for other counseling  recommendations.  Return in about 2 weeks (around 07/18/2018).  No future appointments.  Elsie Lincoln, MD

## 2018-07-18 ENCOUNTER — Ambulatory Visit (INDEPENDENT_AMBULATORY_CARE_PROVIDER_SITE_OTHER): Payer: Medicaid Other | Admitting: Obstetrics & Gynecology

## 2018-07-18 ENCOUNTER — Other Ambulatory Visit: Payer: Self-pay

## 2018-07-18 VITALS — BP 100/65 | HR 9 | Wt 166.0 lb

## 2018-07-18 DIAGNOSIS — Z3483 Encounter for supervision of other normal pregnancy, third trimester: Secondary | ICD-10-CM

## 2018-07-18 DIAGNOSIS — Z3A31 31 weeks gestation of pregnancy: Secondary | ICD-10-CM

## 2018-07-18 DIAGNOSIS — Z3480 Encounter for supervision of other normal pregnancy, unspecified trimester: Secondary | ICD-10-CM

## 2018-07-18 NOTE — Addendum Note (Signed)
Addended by: Allie Bossier on: 07/18/2018 01:05 PM   Modules accepted: Orders

## 2018-07-18 NOTE — Progress Notes (Addendum)
   PRENATAL VISIT NOTE  Subjective:  Lauren Patel is a 23 y.o. G2P1000 at [redacted]w[redacted]d being seen today for ongoing prenatal care.  She is currently monitored for the following issues for this low-risk pregnancy and has Supervision of other normal pregnancy, antepartum; Hx gestational diabetes; Late prenatal care; Traumatic birth; and Anemia during pregnancy on their problem list.  Patient reports no complaints.  Contractions: Not present. Vag. Bleeding: None.  Movement: Present. Denies leaking of fluid.   The following portions of the patient's history were reviewed and updated as appropriate: allergies, current medications, past family history, past medical history, past social history, past surgical history and problem list.   Objective:   Vitals:   07/18/18 1037  BP: 100/65  Pulse: (!) 9  Weight: 166 lb (75.3 kg)    Fetal Status: Fetal Heart Rate (bpm): 138   Movement: Present     General:  Alert, oriented and cooperative. Patient is in no acute distress.  Skin: Skin is warm and dry. No rash noted.   Cardiovascular: Normal heart rate noted  Respiratory: Normal respiratory effort, no problems with respiration noted  Abdomen: Soft, gravid, appropriate for gestational age.  Pain/Pressure: Absent     Pelvic: Cervical exam deferred        Extremities: Normal range of motion.  Edema: Trace  Mental Status: Normal mood and affect. Normal behavior. Normal judgment and thought content.   Assessment and Plan:  Pregnancy: G2P1000 at [redacted]w[redacted]d There are no diagnoses linked to this encounter. Preterm labor symptoms and general obstetric precautions including but not limited to vaginal bleeding, contractions, leaking of fluid and fetal movement were reviewed in detail with the patient. Please refer to After Visit Summary for other counseling recommendations.   No follow-ups on file.  No future appointments.  Allie Bossier, MD   Added to [employer]

## 2018-07-20 IMAGING — DX DG CHEST 2V
2 series · 2 of 2 positions shown · non-contrast
Comparison: None.

CLINICAL DATA: 21-year-old female with shortness of breath for 4
days.

EXAM:
CHEST  2 VIEW

[chest pa]
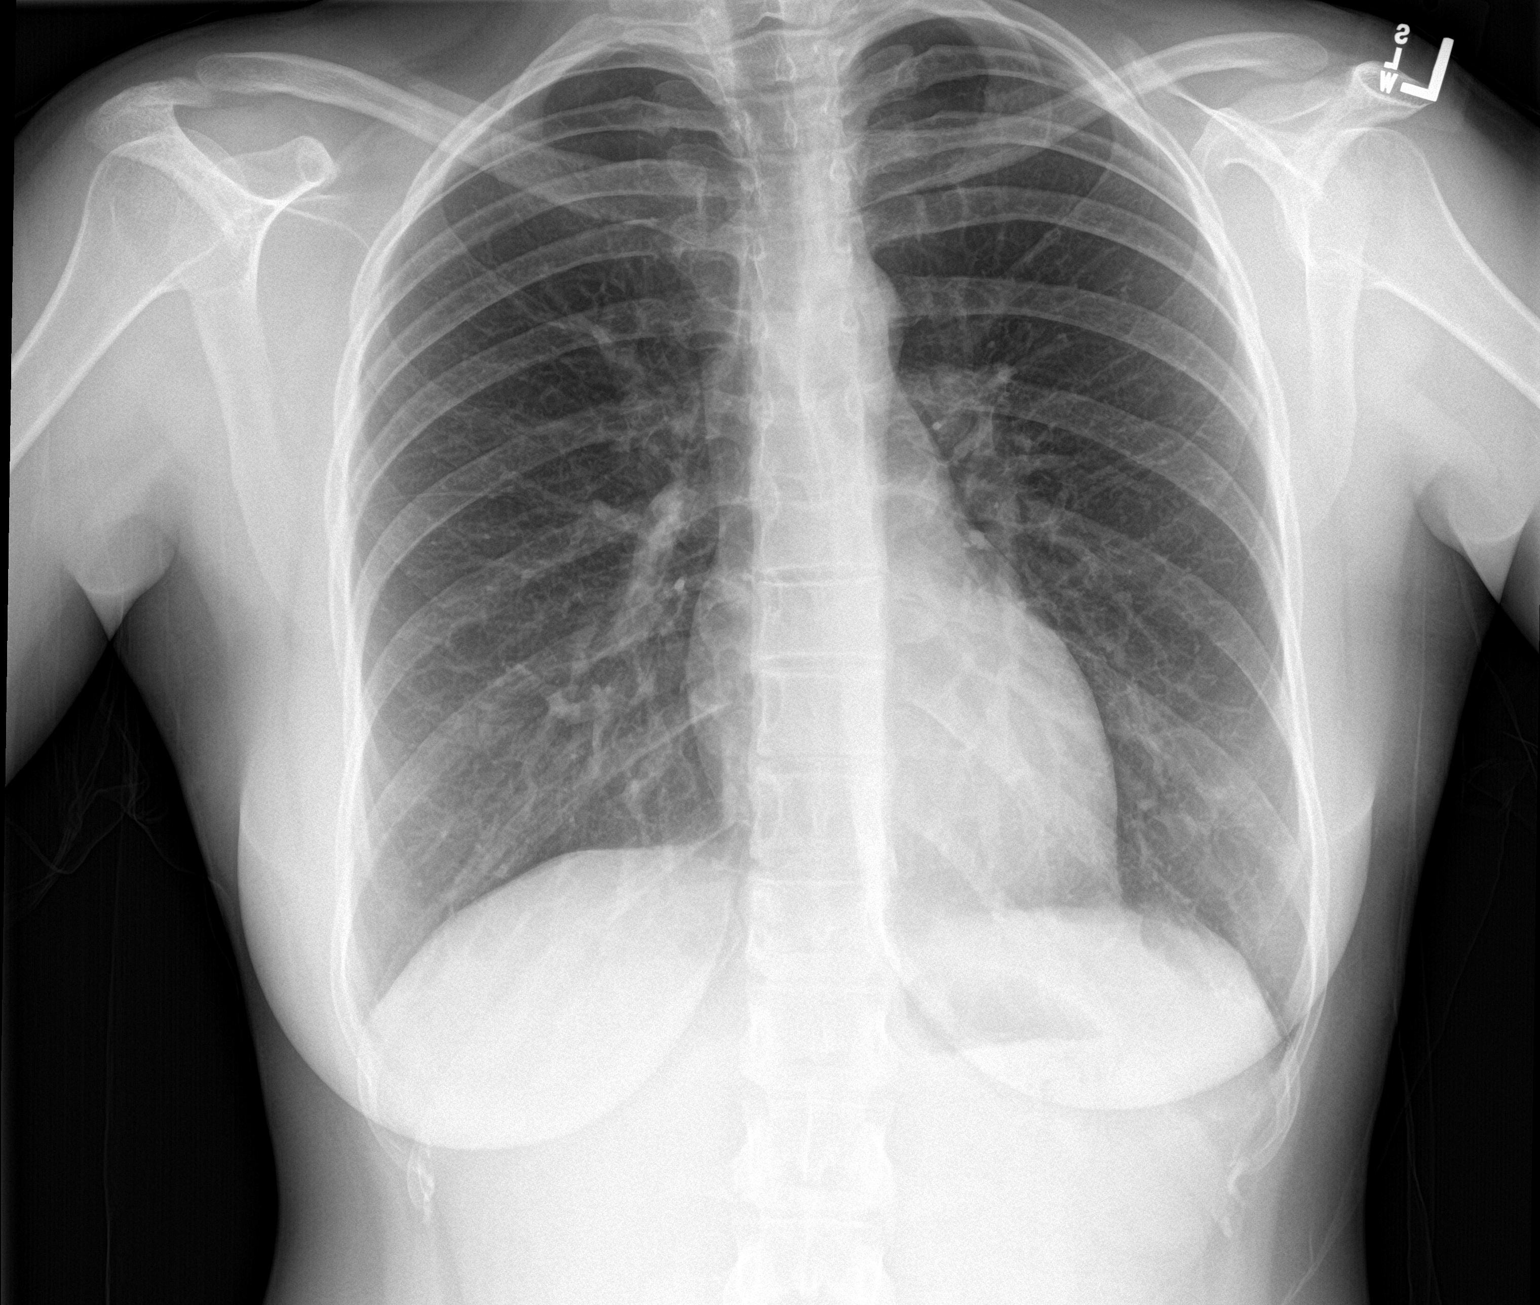

[chest lat]
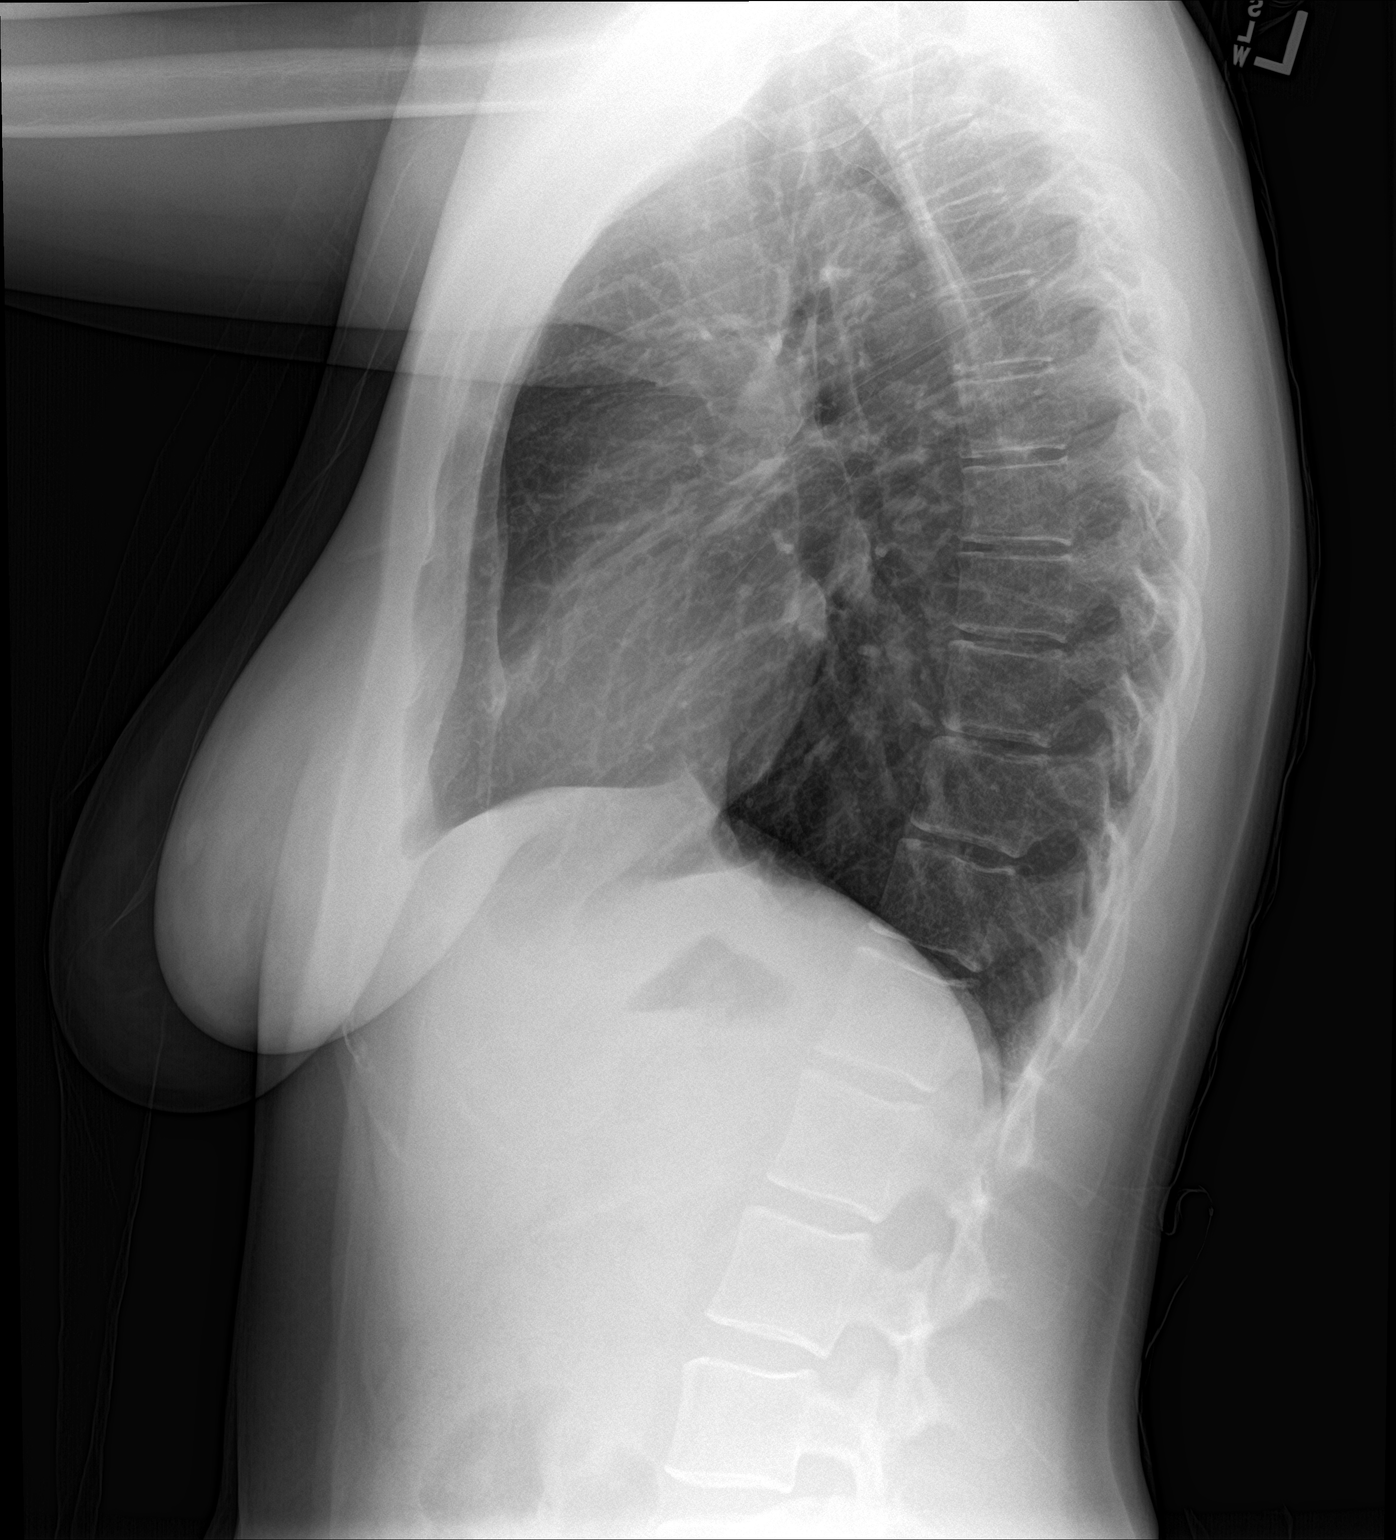

[2 of 2 positions shown; findings below may reference images not displayed]

FINDINGS: Normal lung volumes. Normal cardiac size and mediastinal contours.
Visualized tracheal air column is within normal limits. The lungs
are clear. No pneumothorax or pleural effusion. No osseous
abnormality identified. Negative visible bowel gas pattern.
IMPRESSION: Negative.  No acute cardiopulmonary abnormality.

## 2018-08-01 ENCOUNTER — Encounter: Payer: Medicaid Other | Admitting: Obstetrics & Gynecology

## 2018-08-15 ENCOUNTER — Encounter: Payer: Medicaid Other | Admitting: Obstetrics & Gynecology

## 2018-08-15 ENCOUNTER — Other Ambulatory Visit (HOSPITAL_COMMUNITY)
Admission: RE | Admit: 2018-08-15 | Discharge: 2018-08-15 | Disposition: A | Discharge status: Home / Self Care | Payer: 59 | Source: Ambulatory Visit | Attending: Obstetrics & Gynecology | Admitting: Obstetrics & Gynecology

## 2018-08-15 ENCOUNTER — Ambulatory Visit (INDEPENDENT_AMBULATORY_CARE_PROVIDER_SITE_OTHER): Payer: Medicaid Other | Admitting: Obstetrics & Gynecology

## 2018-08-15 ENCOUNTER — Other Ambulatory Visit: Payer: Self-pay

## 2018-08-15 VITALS — BP 98/65 | HR 77 | Wt 172.0 lb

## 2018-08-15 DIAGNOSIS — Z3A35 35 weeks gestation of pregnancy: Secondary | ICD-10-CM

## 2018-08-15 DIAGNOSIS — Z3480 Encounter for supervision of other normal pregnancy, unspecified trimester: Secondary | ICD-10-CM | POA: Insufficient documentation

## 2018-08-15 DIAGNOSIS — Z3493 Encounter for supervision of normal pregnancy, unspecified, third trimester: Secondary | ICD-10-CM

## 2018-08-15 DIAGNOSIS — Z3490 Encounter for supervision of normal pregnancy, unspecified, unspecified trimester: Secondary | ICD-10-CM

## 2018-08-15 NOTE — Progress Notes (Signed)
   PRENATAL VISIT NOTE  Subjective:  Lauren Patel is a 23 y.o. G2P1000 at [redacted]w[redacted]d being seen today for ongoing prenatal care.  She is currently monitored for the following issues for this low-risk pregnancy and has Supervision of other normal pregnancy, antepartum; Hx gestational diabetes; Late prenatal care; Traumatic birth; and Anemia during pregnancy on their problem list.  Patient reports no complaints.  Contractions: Not present. Vag. Bleeding: None.  Movement: Present. Denies leaking of fluid.   The following portions of the patient's history were reviewed and updated as appropriate: allergies, current medications, past family history, past medical history, past social history, past surgical history and problem list.   Objective:   Vitals:   08/15/18 1324  BP: 98/65  Pulse: 77  Weight: 172 lb (78 kg)    Fetal Status: Fetal Heart Rate (bpm): 131   Movement: Present     General:  Alert, oriented and cooperative. Patient is in no acute distress.  Skin: Skin is warm and dry. No rash noted.   Cardiovascular: Normal heart rate noted  Respiratory: Normal respiratory effort, no problems with respiration noted  Abdomen: Soft, gravid, appropriate for gestational age.  Pain/Pressure: Absent     Pelvic: Cervical exam performed        Extremities: Normal range of motion.  Edema: None  Mental Status: Normal mood and affect. Normal behavior. Normal judgment and thought content.   Assessment and Plan:  Pregnancy: G2P1000 at [redacted]w[redacted]d 1. Supervision of other normal pregnancy, antepartum - cervical cultures today - She received a BP cuff today - Cervicovaginal ancillary only( [place]) - Culture, beta strep (group b only) - Babyscripts Schedule Optimization  2. Pregnancy, unspecified gestational age  Preterm labor symptoms and general obstetric precautions including but not limited to vaginal bleeding, contractions, leaking of fluid and fetal movement were reviewed in detail with  the patient. Please refer to After Visit Summary for other counseling recommendations.   Return for weekly televisits until in person at 40 weeks.  No future appointments.  Allie Bossier, MD

## 2018-08-16 LAB — CERVICOVAGINAL ANCILLARY ONLY
Chlamydia: NEGATIVE
Neisseria Gonorrhea: NEGATIVE

## 2018-08-18 LAB — CULTURE, BETA STREP (GROUP B ONLY)
MICRO NUMBER:: 391475
SPECIMEN QUALITY:: ADEQUATE

## 2018-08-22 ENCOUNTER — Ambulatory Visit (INDEPENDENT_AMBULATORY_CARE_PROVIDER_SITE_OTHER): Payer: Medicaid Other | Admitting: Obstetrics & Gynecology

## 2018-08-22 ENCOUNTER — Other Ambulatory Visit: Payer: Self-pay

## 2018-08-22 VITALS — BP 116/68 | Wt 174.0 lb

## 2018-08-22 DIAGNOSIS — Z3A36 36 weeks gestation of pregnancy: Secondary | ICD-10-CM

## 2018-08-22 DIAGNOSIS — O99019 Anemia complicating pregnancy, unspecified trimester: Secondary | ICD-10-CM

## 2018-08-22 DIAGNOSIS — Z3480 Encounter for supervision of other normal pregnancy, unspecified trimester: Secondary | ICD-10-CM

## 2018-08-22 DIAGNOSIS — O99013 Anemia complicating pregnancy, third trimester: Secondary | ICD-10-CM

## 2018-08-22 NOTE — Progress Notes (Signed)
RT groin pain

## 2018-08-22 NOTE — Progress Notes (Signed)
   TELEHEALTH VIRTUAL OBSTETRICS VISIT ENCOUNTER NOTE  I connected with Lauren Patel on 08/22/18 at  8:15 AM EDT by telephone at home and verified that I am speaking with the correct person using two identifiers.   I discussed the limitations, risks, security and privacy concerns of performing an evaluation and management service by telephone and the availability of in person appointments. I also discussed with the patient that there may be a patient responsible charge related to this service. The patient expressed understanding and agreed to proceed.  Subjective:  Lauren Patel is a 23 y.o. G2P1000 at [redacted]w[redacted]d being followed for ongoing prenatal care.  She is currently monitored for the following issues for this low-risk pregnancy and has Supervision of other normal pregnancy, antepartum; Hx gestational diabetes; Late prenatal care; Traumatic birth; and Anemia during pregnancy on their problem list.  Patient reports no complaints. Reports fetal movement. Denies any contractions, bleeding or leaking of fluid.   The following portions of the patient's history were reviewed and updated as appropriate: allergies, current medications, past family history, past medical history, past social history, past surgical history and problem list.   Objective:   General:  Alert, oriented and cooperative.   Mental Status: Normal mood and affect perceived. Normal judgment and thought content.  Rest of physical exam deferred due to type of encounter  Assessment and Plan:  Pregnancy: G2P1000 at [redacted]w[redacted]d 1. Anemia during pregnancy   2. Supervision of other normal pregnancy, antepartum   Preterm labor symptoms and general obstetric precautions including but not limited to vaginal bleeding, contractions, leaking of fluid and fetal movement were reviewed in detail with the patient.  I discussed the assessment and treatment plan with the patient. The patient was provided an opportunity to ask  questions and all were answered. The patient agreed with the plan and demonstrated an understanding of the instructions. The patient was advised to call back or seek an in-person office evaluation/go to MAU at [facility] for any urgent or concerning symptoms. Please refer to After Visit Summary for other counseling recommendations.   I provided 8 minutes of non-face-to-face time during this encounter.  No follow-ups on file.  Future Appointments  Date Time Provider Department Center  08/29/2018  8:15 AM Allie Bossier, MD [facility]  09/05/2018  8:45 AM Lesly Dukes, MD [facility]  09/12/2018  8:45 AM Lesly Dukes, MD [facility]  09/19/2018  8:15 AM Penne Lash, Fredrich Romans, MD [facility]    Allie Bossier, MD Center for [facility], [facility]

## 2018-08-29 ENCOUNTER — Other Ambulatory Visit: Payer: Self-pay

## 2018-08-29 ENCOUNTER — Ambulatory Visit (INDEPENDENT_AMBULATORY_CARE_PROVIDER_SITE_OTHER): Payer: Medicaid Other | Admitting: Obstetrics & Gynecology

## 2018-08-29 VITALS — BP 95/69 | HR 80 | Wt 179.0 lb

## 2018-08-29 DIAGNOSIS — Z3A37 37 weeks gestation of pregnancy: Secondary | ICD-10-CM

## 2018-08-29 DIAGNOSIS — Z3480 Encounter for supervision of other normal pregnancy, unspecified trimester: Secondary | ICD-10-CM

## 2018-08-29 DIAGNOSIS — Z3483 Encounter for supervision of other normal pregnancy, third trimester: Secondary | ICD-10-CM

## 2018-08-29 NOTE — Progress Notes (Signed)
BP on April 27:  95/69  Pt c/o back pain and leg pain

## 2018-08-29 NOTE — Progress Notes (Signed)
   PRENATAL VISIT NOTE TELEHEALTH VIRTUAL OBSTETRICS VISIT ENCOUNTER NOTE  I connected with@ on 08/29/18 at  8:15 AM EDT by Webex at home and verified that I am speaking with the correct person using two identifiers.   I discussed the limitations, risks, security and privacy concerns of performing an evaluation and management service by telephone and the availability of in person appointments. I also discussed with the patient that there may be a patient responsible charge related to this service. The patient expressed understanding and agreed to proceed. Subjective:  Lauren Patel is a 23 y.o. G2P1000 at [redacted]w[redacted]d being seen today for ongoing prenatal care.  She is currently monitored for the following issues for this low-risk pregnancy and has Supervision of other normal pregnancy, antepartum; Hx gestational diabetes; Late prenatal care; Traumatic birth; and Anemia during pregnancy on their problem list.  Patient reports no complaints.  Reports fetal movement.  .  .   . Denies any contractions, bleeding or leaking of fluid.   The following portions of the patient's history were reviewed and updated as appropriate: allergies, current medications, past family history, past medical history, past social history, past surgical history and problem list.   Objective:  There were no vitals filed for this visit.  Fetal Status:           General:  Alert, oriented and cooperative. Patient is in no acute distress.  Respiratory: Normal respiratory effort, no problems with respiration noted  Mental Status: Normal mood and affect. Normal behavior. Normal judgment and thought content.  Rest of physical exam deferred due to type of encounter  Assessment and Plan:  Pregnancy: G2P1000 at [redacted]w[redacted]d 1. Supervision of other normal pregnancy, antepartum - taking MVI and iron pills daily  Term labor symptoms and general obstetric precautions including but not limited to vaginal bleeding, contractions,  leaking of fluid and fetal movement were reviewed in detail with the patient. I discussed the assessment and treatment plan with the patient. The patient was provided an opportunity to ask questions and all were answered. The patient agreed with the plan and demonstrated an understanding of the instructions. The patient was advised to call back or seek an in-person office evaluation/go to MAU at [facility] for any urgent or concerning symptoms. Please refer to After Visit Summary for other counseling recommendations.  I provided 10 minutes of non-face-to-face time during this encounter. No follow-ups on file.  Future Appointments  Date Time Provider Department Center  09/08/2018  1:45 PM Allie Bossier, MD [facility]  09/12/2018  8:45 AM Lesly Dukes, MD [facility]  09/19/2018  8:15 AM Penne Lash, Fredrich Romans, MD [facility]    Allie Bossier, MD Center for [facility], [facility]

## 2018-09-05 ENCOUNTER — Encounter: Payer: Medicaid Other | Admitting: Obstetrics & Gynecology

## 2018-09-08 ENCOUNTER — Other Ambulatory Visit: Payer: Self-pay

## 2018-09-08 ENCOUNTER — Ambulatory Visit (INDEPENDENT_AMBULATORY_CARE_PROVIDER_SITE_OTHER): Payer: 59 | Admitting: Obstetrics & Gynecology

## 2018-09-08 VITALS — BP 100/68

## 2018-09-08 DIAGNOSIS — O99013 Anemia complicating pregnancy, third trimester: Secondary | ICD-10-CM

## 2018-09-08 DIAGNOSIS — O99019 Anemia complicating pregnancy, unspecified trimester: Secondary | ICD-10-CM

## 2018-09-08 DIAGNOSIS — Z3A38 38 weeks gestation of pregnancy: Secondary | ICD-10-CM

## 2018-09-08 DIAGNOSIS — Z3480 Encounter for supervision of other normal pregnancy, unspecified trimester: Secondary | ICD-10-CM

## 2018-09-08 NOTE — Progress Notes (Signed)
   TELEHEALTH VIRTUAL OBSTETRICS PRENATAL VISIT ENCOUNTER NOTE  I connected with Lauren Patel on 09/08/18 at  1:45 PM EDT by WebEx at home and verified that I am speaking with the correct person using two identifiers.   I discussed the limitations, risks, security and privacy concerns of performing an evaluation and management service by telephone and the availability of in person appointments. I also discussed with the patient that there may be a patient responsible charge related to this service. The patient expressed understanding and agreed to proceed. Subjective:  Lauren Patel is a 23 y.o. G2P1000 at [redacted]w[redacted]d being seen today for ongoing prenatal care.  She is currently monitored for the following issues for this low-risk pregnancy and has Supervision of other normal pregnancy, antepartum; Hx gestational diabetes; Late prenatal care; Traumatic birth; and Anemia during pregnancy on their problem list.  Patient reports no complaints.  Reports fetal movement.  .  .   . Denies any contractions, bleeding or leaking of fluid.   The following portions of the patient's history were reviewed and updated as appropriate: allergies, current medications, past family history, past medical history, past social history, past surgical history and problem list.   Objective:   Vitals:   09/08/18 1313  BP: 100/68    Fetal Status:           General:  Alert, oriented and cooperative. Patient is in no acute distress.  Respiratory: Normal respiratory effort, no problems with respiration noted  Mental Status: Normal mood and affect. Normal behavior. Normal judgment and thought content.  Rest of physical exam deferred due to type of encounter  Assessment and Plan:  Pregnancy: G2P1000 at [redacted]w[redacted]d 1. Anemia during pregnancy   2. Supervision of other normal pregnancy, antepartum  Term labor symptoms and general obstetric precautions including but not limited to vaginal bleeding, contractions,  leaking of fluid and fetal movement were reviewed in detail with the patient. I discussed the assessment and treatment plan with the patient. The patient was provided an opportunity to ask questions and all were answered. The patient agreed with the plan and demonstrated an understanding of the instructions. The patient was advised to call back or seek an in-person office evaluation/go to MAU at [facility] for any urgent or concerning symptoms. Please refer to After Visit Summary for other counseling recommendations.   I provided 10 minutes of face-to-face via WebEx time during this encounter.  Follow up 1 week in person   Future Appointments  Date Time Provider Department Center  09/08/2018  1:45 PM Allie Bossier, MD [facility]  09/12/2018  8:45 AM Lesly Dukes, MD [facility]  09/19/2018  8:15 AM Penne Lash, Fredrich Romans, MD [facility]    Allie Bossier, MD Center for [facility], [facility]

## 2018-09-08 NOTE — Progress Notes (Signed)
Pt c/o increased pressure 

## 2018-09-12 ENCOUNTER — Encounter: Payer: Medicaid Other | Admitting: Obstetrics & Gynecology

## 2018-09-13 ENCOUNTER — Other Ambulatory Visit: Payer: Self-pay

## 2018-09-13 ENCOUNTER — Inpatient Hospital Stay (HOSPITAL_COMMUNITY)
Admission: AD | Admit: 2018-09-13 | Discharge: 2018-09-14 | Disposition: A | Discharge status: Home / Self Care | Payer: 59 | Attending: Obstetrics and Gynecology | Admitting: Obstetrics and Gynecology

## 2018-09-13 ENCOUNTER — Encounter (HOSPITAL_COMMUNITY): Payer: Self-pay | Admitting: *Deleted

## 2018-09-13 DIAGNOSIS — Z825 Family history of asthma and other chronic lower respiratory diseases: Secondary | ICD-10-CM | POA: Insufficient documentation

## 2018-09-13 DIAGNOSIS — O26893 Other specified pregnancy related conditions, third trimester: Secondary | ICD-10-CM

## 2018-09-13 DIAGNOSIS — Z3A39 39 weeks gestation of pregnancy: Secondary | ICD-10-CM

## 2018-09-13 DIAGNOSIS — Z8632 Personal history of gestational diabetes: Secondary | ICD-10-CM

## 2018-09-13 DIAGNOSIS — Z7982 Long term (current) use of aspirin: Secondary | ICD-10-CM | POA: Insufficient documentation

## 2018-09-13 DIAGNOSIS — M549 Dorsalgia, unspecified: Secondary | ICD-10-CM | POA: Insufficient documentation

## 2018-09-13 DIAGNOSIS — M79605 Pain in left leg: Secondary | ICD-10-CM

## 2018-09-13 DIAGNOSIS — Z3480 Encounter for supervision of other normal pregnancy, unspecified trimester: Secondary | ICD-10-CM

## 2018-09-13 LAB — URINALYSIS, ROUTINE W REFLEX MICROSCOPIC
Bacteria, UA: NONE SEEN
Bilirubin Urine: NEGATIVE
Glucose, UA: NEGATIVE mg/dL
Hgb urine dipstick: NEGATIVE
Ketones, ur: NEGATIVE mg/dL
Nitrite: NEGATIVE
Protein, ur: NEGATIVE mg/dL
Specific Gravity, Urine: 1.01 (ref 1.005–1.030)
pH: 7 (ref 5.0–8.0)

## 2018-09-13 MED ORDER — CYCLOBENZAPRINE HCL 10 MG PO TABS
10.0000 mg | ORAL_TABLET | Freq: Once | ORAL | Status: AC
  Administered 2018-09-14: 10 mg via ORAL
  Filled 2018-09-13: qty 1

## 2018-09-13 NOTE — MAU Provider Note (Signed)
History     CSN: [id number]  Arrival date and time: 09/13/18 2229   First Provider Initiated Contact with Patient 09/13/18 2340      Chief Complaint  Patient presents with  . Leg Pain   Lauren GunningShayla M Patel is a 23 y.o. G2P1001 at 1362w3d who presents for Leg and Back Pain.  She states the pain started at lunch time and has been constant.  She reports the pain is only on her left side and describes it as tingling in her back and tightness in her leg/calf area.  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  She states their is no relieving factors, but applying pressure to her back makes the pain worse.  She states she has not taken any medication for the pain, which she rates at a 7-8.  She reports having 3 bottles of water today.  Patient endorses fetal movement and denies vaginal concerns and contractions.       OB History    Gravida  2   Para  1   Term  1   Preterm      AB      Living        SAB      TAB      Ectopic      Multiple      Live Births  1           Past Medical History:  Diagnosis Date  . Anemia   . Gestational diabetes     Past Surgical History:  Procedure Laterality Date  . MOUTH SURGERY      Family History  Problem Relation Age of Onset  . Asthma Mother   . Diabetes Maternal Grandmother   . Breast cancer Maternal Grandmother   . Hypertension Maternal Grandmother   . Asthma Maternal Grandmother   . Breast cancer Paternal Grandmother     Social History   Tobacco Use  . Smoking status: Passive Smoke Exposure - Never Smoker  . Smokeless tobacco: Never Used  Substance Use Topics  . Alcohol use: No  . Drug use: No    Allergies: No Known Allergies  Medications Prior to Admission  Medication Sig Dispense Refill Last Dose  . aspirin EC 81 MG tablet Take 1 tablet (81 mg total) by mouth daily. 90 tablet 1 09/13/2018 at Unknown time  . docusate sodium (COLACE) 100 MG  capsule Take 100 mg by mouth 2 (two) times daily.   09/13/2018 at Unknown time  . ferrous sulfate 325 (65 FE) MG EC tablet Take 325 mg by mouth daily with breakfast.   09/13/2018 at Unknown time  . Prenatal MV-Min-Fe Fum-FA-DHA (PRENATAL 1 PO) Take by mouth.   09/13/2018 at Unknown time  . ondansetron (ZOFRAN) 4 MG tablet Take 1 tablet (4 mg total) by mouth every 6 (six) hours. (Patient not taking: Reported on 02/21/2018) 12 tablet 0 More than a month at Unknown time    Review of Systems  Constitutional: Negative for chills and fever.  Respiratory: Negative for cough and shortness of breath.   Gastrointestinal: Negative for abdominal pain, constipation, diarrhea, nausea and vomiting.  Genitourinary: Negative for dysuria, vaginal bleeding and vaginal discharge.  Musculoskeletal: Positive for back pain and myalgias (Leg/Calf Pain).  Neurological: Negative for dizziness, light-headedness and headaches.   Physical Exam   Blood pressure 106/63, pulse 80, temperature 98.4 F (36.9 C), temperature source Oral, resp.  rate 20, height 5\' 7"  (1.702 m), weight 85.8 kg, last menstrual period 12/11/2017.  Physical Exam  Constitutional: She is oriented to person, place, and time. She appears well-developed and well-nourished. No distress.  HENT:  Head: Normocephalic and atraumatic.  Eyes: Conjunctivae are normal.  Neck: Normal range of motion.  Cardiovascular: Normal rate, regular rhythm and normal heart sounds.  Respiratory: Effort normal.  GI: Soft. Bowel sounds are normal. There is no abdominal tenderness.  Musculoskeletal: Normal range of motion.        General: No edema.     Left lower leg: She exhibits tenderness. She exhibits no swelling and no deformity. No edema.     Comments: Calf circumference for Left Leg 36cm vs Right Leg 35cm. No apparent swelling, bruising, or masses. Skin warm to touch. Mild tenderness with deep palpation at one distinct area on calf ~6cm below knee.   Neurological:  She is alert and oriented to person, place, and time.  Skin: Skin is warm and dry.  Psychiatric: She has a normal mood and affect. Her behavior is normal.   135 bpm, Mod Var, -Decels, +Accels Toco: Q5-29min MAU Course  Procedures Results for orders placed or performed during the hospital encounter of 09/13/18 (from the past 24 hour(s))  Urinalysis, Routine w reflex microscopic     Status: Abnormal   Collection Time: 09/13/18 11:02 PM  Result Value Ref Range   Color, Urine STRAW (A) YELLOW   APPearance CLEAR CLEAR   Specific Gravity, Urine 1.010 1.005 - 1.030   pH 7.0 5.0 - 8.0   Glucose, UA NEGATIVE NEGATIVE mg/dL   Hgb urine dipstick NEGATIVE NEGATIVE   Bilirubin Urine NEGATIVE NEGATIVE   Ketones, ur NEGATIVE NEGATIVE mg/dL   Protein, ur NEGATIVE NEGATIVE mg/dL   Nitrite NEGATIVE NEGATIVE   Leukocytes,Ua SMALL (A) NEGATIVE   RBC / HPF 0-5 0 - 5 RBC/hpf   WBC, UA 0-5 0 - 5 WBC/hpf   Bacteria, UA NONE SEEN NONE SEEN   Squamous Epithelial / LPF 0-5 0 - 5    MDM Physical exam EFM Assessment and Plan  23 year old G2P1001 at 39.4 weeks Cat I FT Leg Pain Back Pain  -Exam findings discussed. -Offered and accepts medication -Flexeril 10mg   -Will give heating pads to apply to back and leg -NST reactive; Okay to discontinue monitoring -Will reassess  Follow Up (1:05 AM)  -Patient reports heating pads to leg have removed constant pain and now rates 3/10. -She reports the flexeril did help with the back pain, but it is only a 5/10. -Offered and declines Tylenol dosing. -Discussed warning signs for PE including swelling, warmth, and pain in legs. -Encouraged elevation of legs, frequent breaks while working, and increased hydration. -Patient verbalizes understanding.  -Reports next appt is tomorrow. -Encouraged to verbalize any changes including new symptoms, worsening of symptoms, or other concerns at that time. Patient without questions or concerns. -Discharged to home in  improved condition  Cherre Robins MSN, CNM 09/13/2018, 11:40 PM

## 2018-09-13 NOTE — MAU Note (Signed)
PT SAYS AT 12 NOON- SHE HAD PAIN IN LEFT CALF AND ANKLE.- FEELS TIGHT.   NOW PAIN  RADIATES INTO HER LOWER BACK- SAYS SLIGHTLY SWOLLEN  AND FEELS LIKE HANDS ARE  SWOLLEN. [facility] [place]- CALLED THEM- TOLD  TO COME IN.   VE  3-4 WEEKS AGO 1 CM.   NO UC'S TODAY.  FEELS BABY MOVE.   DENIES HSV AND MRSA. GBS- NEG

## 2018-09-14 ENCOUNTER — Telehealth (HOSPITAL_COMMUNITY): Payer: Self-pay | Admitting: *Deleted

## 2018-09-14 ENCOUNTER — Ambulatory Visit (INDEPENDENT_AMBULATORY_CARE_PROVIDER_SITE_OTHER): Payer: 59 | Admitting: Obstetrics and Gynecology

## 2018-09-14 ENCOUNTER — Other Ambulatory Visit (HOSPITAL_COMMUNITY): Payer: Self-pay | Admitting: *Deleted

## 2018-09-14 ENCOUNTER — Encounter: Payer: Self-pay | Admitting: Obstetrics and Gynecology

## 2018-09-14 ENCOUNTER — Encounter (HOSPITAL_COMMUNITY): Payer: Self-pay | Admitting: *Deleted

## 2018-09-14 VITALS — BP 98/61 | HR 86 | Wt 186.0 lb

## 2018-09-14 DIAGNOSIS — M549 Dorsalgia, unspecified: Secondary | ICD-10-CM | POA: Diagnosis not present

## 2018-09-14 DIAGNOSIS — O26893 Other specified pregnancy related conditions, third trimester: Secondary | ICD-10-CM

## 2018-09-14 DIAGNOSIS — Z3A39 39 weeks gestation of pregnancy: Secondary | ICD-10-CM

## 2018-09-14 DIAGNOSIS — Z3483 Encounter for supervision of other normal pregnancy, third trimester: Secondary | ICD-10-CM

## 2018-09-14 DIAGNOSIS — Z3480 Encounter for supervision of other normal pregnancy, unspecified trimester: Secondary | ICD-10-CM

## 2018-09-14 NOTE — Addendum Note (Signed)
Addended by: Venia Carbon I on: 09/14/2018 03:17 PM   Modules accepted: Orders, SmartSet

## 2018-09-14 NOTE — Discharge Instructions (Signed)
Back Pain in Pregnancy  Back pain during pregnancy is common. Back pain may be caused by several factors that are related to changes during your pregnancy.  Follow these instructions at home:  Managing pain, stiffness, and swelling          If directed, for sudden (acute) back pain, put ice on the painful area.  ? Put ice in a plastic bag.  ? Place a towel between your skin and the bag.  ? Leave the ice on for 20 minutes, 2-3 times per day.   If directed, apply heat to the affected area before you exercise. Use the heat source that your health care provider recommends, such as a moist heat pack or a heating pad.  ? Place a towel between your skin and the heat source.  ? Leave the heat on for 20-30 minutes.  ? Remove the heat if your skin turns bright red. This is especially important if you are unable to feel pain, heat, or cold. You may have a greater risk of getting burned.   If directed, massage the affected area.  Activity   Exercise as told by your health care provider. Gentle exercise is the best way to prevent or manage back pain.   Listen to your body when lifting. If lifting hurts, ask for help or bend your knees. This uses your leg muscles instead of your back muscles.   Squat down when picking up something from the floor. Do not bend over.   Only use bed rest for short periods as told by your health care provider. Bed rest should only be used for the most severe episodes of back pain.  Standing, sitting, and lying down   Do not stand in one place for long periods of time.   Use good posture when sitting. Make sure your head rests over your shoulders and is not hanging forward. Use a pillow on your lower back if necessary.   Try sleeping on your side, preferably the left side, with a pregnancy support pillow or 1-2 regular pillows between your legs.  ? If you have back pain after a night's rest, your bed may be too soft.  ? A firm mattress may provide more support for your back during  pregnancy.  General instructions   Do not wear high heels.   Eat a healthy diet. Try to gain weight within your health care provider's recommendations.   Use a maternity girdle, elastic sling, or back brace as told by your health care provider.   Take over-the-counter and prescription medicines only as told by your health care provider.   Work with a physical therapist or massage therapist to find ways to manage back pain. Acupuncture or massage therapy may be helpful.   Keep all follow-up visits as told by your health care provider. This is important.  Contact a health care provider if:   Your back pain interferes with your daily activities.   You have increasing pain in other parts of your body.  Get help right away if:   You develop numbness, tingling, weakness, or problems with the use of your arms or legs.   You develop severe back pain that is not controlled with medicine.   You have a change in bowel or bladder control.   You develop shortness of breath, dizziness, or you faint.   You develop nausea, vomiting, or sweating.   You have back pain that is a rhythmic, cramping pain similar to labor pains. Labor   pain is usually 1-2 minutes apart, lasts for about 1 minute, and involves a bearing down feeling or pressure in your pelvis.   You have back pain and your water breaks or you have vaginal bleeding.   You have back pain or numbness that travels down your leg.   Your back pain developed after you fell.   You develop pain on one side of your back.   You see blood in your urine.   You develop skin blisters in the area of your back pain.  Summary   Back pain may be caused by several factors that are related to changes during your pregnancy.   Follow instructions as told by your health care provider for managing pain, stiffness, and swelling.   Exercise as told by your health care provider. Gentle exercise is the best way to prevent or manage back pain.   Take over-the-counter and  prescription medicines only as told by your health care provider.   Keep all follow-up visits as told by your health care provider. This is important.  This information is not intended to replace advice given to you by your health care provider. Make sure you discuss any questions you have with your health care provider.  Document Released: 07/29/2005 Document Revised: 10/06/2017 Document Reviewed: 10/06/2017  Elsevier Interactive Patient Education  2019 [employer]

## 2018-09-14 NOTE — Progress Notes (Signed)
   PRENATAL VISIT NOTE  Subjective:  Lauren Patel is a 23 y.o. G2P1000 at [redacted]w[redacted]d being seen today for ongoing prenatal care.  She is currently monitored for the following issues for this low-risk pregnancy and has Supervision of other normal pregnancy, antepartum; Hx gestational diabetes; Late prenatal care; Traumatic birth; and Anemia during pregnancy on their problem list.  Patient reports backache.  Contractions: Irritability. Vag. Bleeding: None.  Movement: PresentDenies leaking of fluid.   The following portions of the patient's history were reviewed and updated as appropriate: allergies, current medications, past family history, past medical history, past social history, past surgical history and problem list.   Objective:   Vitals:   09/14/18 0817  BP: 98/61  Pulse: 86  Weight: 186 lb (84.4 kg)    Fetal Status: Fetal Heart Rate (bpm): 128 Fundal Height: 39 cm Movement: Present  Presentation: Vertex  General:  Alert, oriented and cooperative. Patient is in no acute distress.  Skin: Skin is warm and dry. No rash noted.   Cardiovascular: Normal heart rate noted  Respiratory: Normal respiratory effort, no problems with respiration noted  Abdomen: Soft, gravid, appropriate for gestational age.  Pain/Pressure: Present     Pelvic: Cervical exam performed Dilation: 2 Effacement (%): 50, 60 Station: -3  Extremities: Normal range of motion.  Edema: Trace  Mental Status: Normal mood and affect. Normal behavior. Normal judgment and thought content.   Assessment and Plan:  Pregnancy: G2P1000 at [redacted]w[redacted]d  1. Supervision of other normal pregnancy, antepartum  Doing well Discussed [employer] [place] [employer] and primrose oil for home use Return on 5/21 for in-office visit for Foley Bulb placement and NST; induction scheduled for 5/22 @ 0700 Labor precautions discussed   There are no diagnoses linked to this encounter. Term labor symptoms and general obstetric precautions including but  not limited to vaginal bleeding, contractions, leaking of fluid and fetal movement were reviewed in detail with the patient. Please refer to After Visit Summary for other counseling recommendations.   Return in about 1 week (around 09/21/2018) for Please change her visit from 5/18 to to 5/21 for NST and foley insertion. .  Future Appointments  Date Time Provider Department Center  09/22/2018  2:00 PM Allie Bossier, MD [facility]  09/23/2018  7:00 AM [facility] SCHED ROOM [facility] None    Venia Carbon, NP

## 2018-09-14 NOTE — Telephone Encounter (Signed)
Preadmission screen  

## 2018-09-14 NOTE — Patient Instructions (Signed)
[employer] circuit  Prime rose oil vitamin 3-4x per day either oral or vaginal.    OUTPATIENT FOLEY BULB INDUCTION OF LABOR:  Information Sheet for Mothers and Family               What's a Foley Bulb Induction? A Foley bulb induction is a procedure where your provider inserts a catheter into your cervix. Once inside your womb, your provider inflates the balloon with a saline solution.   This puts pressure on your cervix and encourages dilation. The catheter falls out once your cervix dilates to 3-4 centimeters.     With any procedure, it's important that you know what to expect. The insertion of a Foley catheter can be a bit uncomfortable, and some women experience sharp pelvic pain. The pain may subside once the catheter is in place. You may experience some cramping when the Foley catheter is in place.  This is normal.     GO TO THE MATERNITY ADMISSIONS UNIT FOR THE FOLLOWING:  Heavy vaginal bleeding  Rupture of membranes (fluid that wets your underwear)  Painful uterine contractions every 5 minutes or less  Severe abdominal discomfort  Decreased movement of the baby

## 2018-09-16 ENCOUNTER — Inpatient Hospital Stay (HOSPITAL_COMMUNITY): Payer: 59 | Admitting: Anesthesiology

## 2018-09-16 ENCOUNTER — Encounter (HOSPITAL_COMMUNITY): Payer: Self-pay | Admitting: *Deleted

## 2018-09-16 ENCOUNTER — Inpatient Hospital Stay (HOSPITAL_COMMUNITY)
Admission: AD | Admit: 2018-09-16 | Discharge: 2018-09-18 | DRG: 807 | Disposition: A | Discharge status: Home / Self Care | Payer: 59 | Attending: Obstetrics and Gynecology | Admitting: Obstetrics and Gynecology

## 2018-09-16 ENCOUNTER — Other Ambulatory Visit: Payer: Self-pay

## 2018-09-16 DIAGNOSIS — D649 Anemia, unspecified: Secondary | ICD-10-CM | POA: Diagnosis present

## 2018-09-16 DIAGNOSIS — O99019 Anemia complicating pregnancy, unspecified trimester: Secondary | ICD-10-CM | POA: Diagnosis present

## 2018-09-16 DIAGNOSIS — O0930 Supervision of pregnancy with insufficient antenatal care, unspecified trimester: Secondary | ICD-10-CM

## 2018-09-16 DIAGNOSIS — Z3A40 40 weeks gestation of pregnancy: Secondary | ICD-10-CM | POA: Diagnosis not present

## 2018-09-16 DIAGNOSIS — Z3A39 39 weeks gestation of pregnancy: Secondary | ICD-10-CM | POA: Diagnosis not present

## 2018-09-16 DIAGNOSIS — O9902 Anemia complicating childbirth: Secondary | ICD-10-CM | POA: Diagnosis present

## 2018-09-16 DIAGNOSIS — Z8632 Personal history of gestational diabetes: Secondary | ICD-10-CM | POA: Diagnosis present

## 2018-09-16 DIAGNOSIS — Z1159 Encounter for screening for other viral diseases: Secondary | ICD-10-CM

## 2018-09-16 DIAGNOSIS — Z7722 Contact with and (suspected) exposure to environmental tobacco smoke (acute) (chronic): Secondary | ICD-10-CM | POA: Diagnosis present

## 2018-09-16 DIAGNOSIS — O26893 Other specified pregnancy related conditions, third trimester: Secondary | ICD-10-CM | POA: Diagnosis present

## 2018-09-16 LAB — CBC
HCT: 39.3 % (ref 36.0–46.0)
Hemoglobin: 12.1 g/dL (ref 12.0–15.0)
MCH: 27.4 pg (ref 26.0–34.0)
MCHC: 30.8 g/dL (ref 30.0–36.0)
MCV: 88.9 fL (ref 80.0–100.0)
Platelets: 180 10*3/uL (ref 150–400)
RBC: 4.42 MIL/uL (ref 3.87–5.11)
RDW: 14.3 % (ref 11.5–15.5)
WBC: 14.1 10*3/uL — ABNORMAL HIGH (ref 4.0–10.5)
nRBC: 0 % (ref 0.0–0.2)

## 2018-09-16 LAB — TYPE AND SCREEN
ABO/RH(D): O POS
Antibody Screen: NEGATIVE

## 2018-09-16 LAB — SARS CORONAVIRUS 2 BY RT PCR (HOSPITAL ORDER, PERFORMED IN ~~LOC~~ HOSPITAL LAB): SARS Coronavirus 2: NEGATIVE

## 2018-09-16 LAB — ABO/RH: ABO/RH(D): O POS

## 2018-09-16 MED ORDER — LIDOCAINE HCL (PF) 1 % IJ SOLN: 30.0000 mL | INTRAMUSCULAR | Status: DC | PRN

## 2018-09-16 MED ORDER — ONDANSETRON HCL 4 MG/2ML IJ SOLN: 4.0000 mg | Freq: Four times a day (QID) | INTRAMUSCULAR | Status: DC | PRN

## 2018-09-16 MED ORDER — OXYTOCIN 40 UNITS IN NORMAL SALINE INFUSION - SIMPLE MED
2.5000 [IU]/h | INTRAVENOUS | Status: DC
  Administered 2018-09-17: 2.5 [IU]/h via INTRAVENOUS

## 2018-09-16 MED ORDER — LACTATED RINGERS IV SOLN
500.0000 mL | Freq: Once | INTRAVENOUS | Status: AC
  Administered 2018-09-16: 500 mL via INTRAVENOUS

## 2018-09-16 MED ORDER — SODIUM CHLORIDE (PF) 0.9 % IJ SOLN
INTRAMUSCULAR | Status: DC | PRN
  Administered 2018-09-16: 14 mL/h via EPIDURAL

## 2018-09-16 MED ORDER — OXYCODONE-ACETAMINOPHEN 5-325 MG PO TABS: 1.0000 | ORAL_TABLET | ORAL | Status: DC | PRN

## 2018-09-16 MED ORDER — FENTANYL-BUPIVACAINE-NACL 0.5-0.125-0.9 MG/250ML-% EP SOLN
12.0000 mL/h | EPIDURAL | Status: DC | PRN
  Filled 2018-09-16: qty 250

## 2018-09-16 MED ORDER — OXYTOCIN BOLUS FROM INFUSION: 500.0000 mL | Freq: Once | INTRAVENOUS | Status: DC

## 2018-09-16 MED ORDER — SOD CITRATE-CITRIC ACID 500-334 MG/5ML PO SOLN: 30.0000 mL | ORAL | Status: DC | PRN

## 2018-09-16 MED ORDER — PHENYLEPHRINE 40 MCG/ML (10ML) SYRINGE FOR IV PUSH (FOR BLOOD PRESSURE SUPPORT): 80.0000 ug | PREFILLED_SYRINGE | INTRAVENOUS | Status: DC | PRN

## 2018-09-16 MED ORDER — EPHEDRINE 5 MG/ML INJ: 10.0000 mg | INTRAVENOUS | Status: DC | PRN

## 2018-09-16 MED ORDER — LACTATED RINGERS IV SOLN: 500.0000 mL | INTRAVENOUS | Status: DC | PRN

## 2018-09-16 MED ORDER — LACTATED RINGERS IV SOLN: INTRAVENOUS | Status: DC

## 2018-09-16 MED ORDER — OXYCODONE-ACETAMINOPHEN 5-325 MG PO TABS: 2.0000 | ORAL_TABLET | ORAL | Status: DC | PRN

## 2018-09-16 MED ORDER — FENTANYL-BUPIVACAINE-NACL 0.5-0.125-0.9 MG/250ML-% EP SOLN: 12.0000 mL/h | EPIDURAL | Status: DC | PRN

## 2018-09-16 MED ORDER — LACTATED RINGERS IV SOLN
INTRAVENOUS | Status: DC
  Administered 2018-09-16 (×2): via INTRAVENOUS

## 2018-09-16 MED ORDER — OXYTOCIN BOLUS FROM INFUSION
500.0000 mL | Freq: Once | INTRAVENOUS | Status: AC
  Administered 2018-09-17: 02:00:00 500 mL via INTRAVENOUS

## 2018-09-16 MED ORDER — DIPHENHYDRAMINE HCL 50 MG/ML IJ SOLN: 12.5000 mg | INTRAMUSCULAR | Status: DC | PRN

## 2018-09-16 MED ORDER — OXYTOCIN 40 UNITS IN NORMAL SALINE INFUSION - SIMPLE MED
2.5000 [IU]/h | INTRAVENOUS | Status: DC
  Filled 2018-09-16: qty 1000

## 2018-09-16 MED ORDER — ACETAMINOPHEN 325 MG PO TABS: 650.0000 mg | ORAL_TABLET | ORAL | Status: DC | PRN

## 2018-09-16 MED ORDER — LACTATED RINGERS IV SOLN: 500.0000 mL | Freq: Once | INTRAVENOUS | Status: DC

## 2018-09-16 MED ORDER — LIDOCAINE HCL (PF) 1 % IJ SOLN
INTRAMUSCULAR | Status: DC | PRN
  Administered 2018-09-16 (×2): 5 mL via EPIDURAL

## 2018-09-16 MED ORDER — FENTANYL CITRATE (PF) 100 MCG/2ML IJ SOLN: 100.0000 ug | INTRAMUSCULAR | Status: DC | PRN

## 2018-09-16 NOTE — MAU Note (Signed)
Got her membranes swept on Wed, had some spotting.  Then started losing her mucous plug.  Still coming out, now is pinkish.  Having cramping in lower abd and back.  Was 2 cm on Wed

## 2018-09-16 NOTE — Anesthesia Preprocedure Evaluation (Signed)
Anesthesia Evaluation  Patient identified by MRN, date of birth, ID band  Reviewed: Allergy & Precautions, Patient's Chart, lab work & pertinent test results  Airway Mallampati: I       Dental no notable dental hx.    Pulmonary neg pulmonary ROS,    Pulmonary exam normal        Cardiovascular Normal cardiovascular exam     Neuro/Psych negative neurological ROS     GI/Hepatic negative GI ROS,   Endo/Other  diabetes  Renal/GU      Musculoskeletal   Abdominal   Peds  Hematology   Anesthesia Other Findings   Reproductive/Obstetrics (+) Pregnancy                             Anesthesia Physical Anesthesia Plan  ASA: II  Anesthesia Plan: Epidural   Post-op Pain Management:    Induction:   PONV Risk Score and Plan:   Airway Management Planned:   Additional Equipment: None  Intra-op Plan:   Post-operative Plan:   Informed Consent: I have reviewed the patients History and Physical, chart, labs and discussed the procedure including the risks, benefits and alternatives for the proposed anesthesia with the patient or authorized representative who has indicated his/her understanding and acceptance.       Plan Discussed with:   Anesthesia Plan Comments: (Lab Results      Component                Value               Date                      WBC                      14.1 (H)            09/16/2018                HGB                      12.1                09/16/2018                HCT                      39.3                09/16/2018                MCV                      88.9                09/16/2018                PLT                      180                 09/16/2018           )        Anesthesia Quick Evaluation

## 2018-09-16 NOTE — Anesthesia Procedure Notes (Signed)
Epidural Patient location during procedure: OB Start time: 09/16/2018 7:46 PM End time: 09/16/2018 7:52 PM  Staffing Anesthesiologist: Shelton Silvas, MD Performed: anesthesiologist   Preanesthetic Checklist Completed: patient identified, site marked, surgical consent, pre-op evaluation, timeout performed, IV checked, risks and benefits discussed and monitors and equipment checked  Epidural Patient position: sitting Prep: ChloraPrep Patient monitoring: heart rate, continuous pulse ox and blood pressure Approach: midline Location: L3-L4 Injection technique: LOR saline  Needle:  Needle type: Tuohy  Needle gauge: 17 G Needle length: 9 cm Catheter type: closed end flexible Catheter size: 20 Guage Test dose: negative and 1.5% lidocaine  Assessment Events: blood not aspirated, injection not painful, no injection resistance and no paresthesia  Additional Notes LOR @ 4  Patient identified. Risks/Benefits/Options discussed with patient including but not limited to bleeding, infection, nerve damage, paralysis, failed block, incomplete pain control, headache, blood pressure changes, nausea, vomiting, reactions to medications, itching and postpartum back pain. Confirmed with bedside nurse the patient's most recent platelet count. Confirmed with patient that they are not currently taking any anticoagulation, have any bleeding history or any family history of bleeding disorders. Patient expressed understanding and wished to proceed. All questions were answered. Sterile technique was used throughout the entire procedure. Please see nursing notes for vital signs. Test dose was given through epidural catheter and negative prior to continuing to dose epidural or start infusion. Warning signs of high block given to the patient including shortness of breath, tingling/numbness in hands, complete motor block, or any concerning symptoms with instructions to call for help. Patient was given instructions on  fall risk and not to get out of bed. All questions and concerns addressed with instructions to call with any issues or inadequate analgesia.    Reason for block:procedure for pain

## 2018-09-16 NOTE — H&P (Signed)
LABOR AND DELIVERY ADMISSION HISTORY AND PHYSICAL NOTE  Lauren Patel is a 23 y.o. female G2P1000 with IUP at [redacted]w[redacted]d by LMP c/w 23 week sono presenting for SOL. Cervix changed from 3 to 4.5 cm in MAU over the course of an hour and patient uncomfortable with contractions. Membranes were swept two days ago. She reports positive fetal movement. She denies leakage of fluid or vaginal bleeding.   Prenatal History/Complications: PNC at [facility]  Pregnancy complications:  - late to [facility], established at 36 weeks  - h/o PPH requiring transfusion of 2uPRBC  - h/o GDM with first pregnancy  - anemia during pregnancy   Past Medical History: Past Medical History:  Diagnosis Date  . Anemia   . Gestational diabetes    first child    Past Surgical History: Past Surgical History:  Procedure Laterality Date  . MOUTH SURGERY      Obstetrical History: OB History    Gravida  2   Para  1   Term  1   Preterm      AB      Living        SAB      TAB      Ectopic      Multiple      Live Births  1           Social History: Social History   Socioeconomic History  . Marital status: Single    Spouse name: Not on file  . Number of children: Not on file  . Years of education: Not on file  . Highest education level: Not on file  Occupational History  . Occupation: Armed forces training and education officer  Social Needs  . Financial resource strain: Not hard at all  . Food insecurity:    Worry: Never true    Inability: Never true  . Transportation needs:    Medical: No    Non-medical: Not on file  Tobacco Use  . Smoking status: Passive Smoke Exposure - Never Smoker  . Smokeless tobacco: Never Used  Substance and Sexual Activity  . Alcohol use: No  . Drug use: No  . Sexual activity: Yes    Partners: Male    Birth control/protection: None  Lifestyle  . Physical activity:    Days per week: Not on file    Minutes per session: Not on file  . Stress: Only a little  Relationships  . Social  connections:    Talks on phone: Not on file    Gets together: Not on file    Attends religious service: Not on file    Active member of club or organization: Not on file    Attends meetings of clubs or organizations: Not on file    Relationship status: Not on file  Other Topics Concern  . Not on file  Social History Narrative  . Not on file    Family History: Family History  Problem Relation Age of Onset  . Asthma Mother   . Diabetes Maternal Grandmother   . Breast cancer Maternal Grandmother   . Hypertension Maternal Grandmother   . Asthma Maternal Grandmother   . Breast cancer Paternal Grandmother     Allergies: No Known Allergies  Medications Prior to Admission  Medication Sig Dispense Refill Last Dose  . aspirin EC 81 MG tablet Take 1 tablet (81 mg total) by mouth daily. 90 tablet 1 09/16/2018 at 1000  . ferrous sulfate 325 (65 FE) MG EC tablet Take 325 mg by  mouth daily with breakfast.   09/16/2018 at 1000  . Prenatal MV-Min-Fe Fum-FA-DHA (PRENATAL 1 PO) Take by mouth.   09/16/2018 at 1000  . docusate sodium (COLACE) 100 MG capsule Take 100 mg by mouth 2 (two) times daily.   09/13/2018     Review of Systems  All systems reviewed and negative except as stated in HPI  Physical Exam Blood pressure 118/74, pulse 95, temperature 98.5 F (36.9 C), temperature source Oral, resp. rate 16, height 5\' 7"  (1.702 m), weight 84.7 kg, last menstrual period 12/11/2017, SpO2 100 %. General appearance: alert, oriented, NAD Lungs: normal respiratory effort Heart: regular rate Abdomen: soft, non-tender; gravid, FH appropriate for GA Extremities: No calf swelling or tenderness Presentation: cephalic by sutures on RN check  Fetal monitoring: 140s/mod/+a/-d Uterine activity: every 2-4 minutes  Dilation: 8 Effacement (%): 80 Station: -2 Exam by:: Grover CanavanBailey Bumgarner, RN  Prenatal labs: ABO, Rh: --/--/O POS (05/15 1855) Antibody: NEG (05/15 1855) Rubella: 2.15 (01/15 1439) RPR:  NON-REACTIVE (02/17 0956)  HBsAg: NON-REACTIVE (01/15 1439)  HIV: NON-REACTIVE (02/17 0956)  GC/Chlamydia: Negative  GBS:   Negative  2-hr GTT: normal Genetic screening:  normal Anatomy US: EFW 63%, complete anatomy U/S, anterior placenta  Prenatal Transfer Tool  Maternal Diabetes: No Genetic Screening: Normal Maternal Ultrasounds/Referrals: Normal Fetal Ultrasounds or other Referrals:  None Maternal Substance Abuse:  No Significant Maternal Medications:  None Significant Maternal Lab Results: None  Results for orders placed or performed during the hospital encounter of 09/16/18 (from the past 24 hour(s))  Type and screen [facility]   Collection Time: 09/16/18  6:55 PM  Result Value Ref Range   ABO/RH(D) O POS    Antibody Screen NEG    Sample Expiration      09/19/2018,2359 Performed at [facility], [address] [place], [place] [address]   SARS Coronavirus 2 (CEPHEID - Performed in [facility] lab), [facility]   Collection Time: 09/16/18  7:01 PM  Result Value Ref Range   SARS Coronavirus 2 NEGATIVE NEGATIVE  CBC   Collection Time: 09/16/18  7:01 PM  Result Value Ref Range   WBC 14.1 (H) 4.0 - 10.5 K/uL   RBC 4.42 3.87 - 5.11 MIL/uL   Hemoglobin 12.1 12.0 - 15.0 g/dL   HCT 78.239.3 95.636.0 - 21.346.0 %   MCV 88.9 80.0 - 100.0 fL   MCH 27.4 26.0 - 34.0 pg   MCHC 30.8 30.0 - 36.0 g/dL   RDW 08.614.3 57.811.5 - 46.915.5 %   Platelets 180 150 - 400 K/uL   nRBC 0.0 0.0 - 0.2 %    Patient Active Problem List   Diagnosis Date Noted  . Indication for care in labor or delivery 09/16/2018  . Indication for care in labor and delivery, antepartum 09/16/2018  . Anemia during pregnancy 06/24/2018  . Late prenatal care 05/19/2018  . Traumatic birth 05/19/2018  . Supervision of other normal pregnancy, antepartum 05/18/2018  . Hx gestational diabetes 05/18/2018    Assessment: Lauren Patel is a 23 y.o. G2P1001 at 3354w6d here for SOL.  #Labor:  Expectant management, anticipate SVD. History of PPH, may consider empiric methergine/cytotec at delivery.  #Pain: Epidural in place #FWB: EFW 8-9lbs by [place]. Category I tracing. #ID:  GBS(-) #MOF: Breast #MOC: Undecided #Circ:  Yes  Tamera StandsLaurel S Dimarco Minkin, DO  09/16/2018, 9:15 PM

## 2018-09-17 ENCOUNTER — Encounter (HOSPITAL_COMMUNITY): Payer: Self-pay

## 2018-09-17 DIAGNOSIS — Z3A40 40 weeks gestation of pregnancy: Secondary | ICD-10-CM

## 2018-09-17 LAB — RPR: RPR Ser Ql: NONREACTIVE

## 2018-09-17 MED ORDER — ACETAMINOPHEN 325 MG PO TABS: 650.0000 mg | ORAL_TABLET | ORAL | Status: DC | PRN

## 2018-09-17 MED ORDER — WITCH HAZEL-GLYCERIN EX PADS: 1.0000 "application " | MEDICATED_PAD | CUTANEOUS | Status: DC | PRN

## 2018-09-17 MED ORDER — DIBUCAINE (PERIANAL) 1 % EX OINT: 1.0000 "application " | TOPICAL_OINTMENT | CUTANEOUS | Status: DC | PRN

## 2018-09-17 MED ORDER — IBUPROFEN 800 MG PO TABS
800.0000 mg | ORAL_TABLET | Freq: Three times a day (TID) | ORAL | Status: DC
  Administered 2018-09-17 – 2018-09-18 (×4): 800 mg via ORAL
  Filled 2018-09-17 (×4): qty 1

## 2018-09-17 MED ORDER — PRENATAL MULTIVITAMIN CH
1.0000 | ORAL_TABLET | Freq: Every day | ORAL | Status: DC
  Administered 2018-09-17 – 2018-09-18 (×2): 1 via ORAL
  Filled 2018-09-17 (×2): qty 1

## 2018-09-17 MED ORDER — ONDANSETRON HCL 4 MG PO TABS: 4.0000 mg | ORAL_TABLET | ORAL | Status: DC | PRN

## 2018-09-17 MED ORDER — SIMETHICONE 80 MG PO CHEW: 80.0000 mg | CHEWABLE_TABLET | ORAL | Status: DC | PRN

## 2018-09-17 MED ORDER — ONDANSETRON HCL 4 MG/2ML IJ SOLN: 4.0000 mg | INTRAMUSCULAR | Status: DC | PRN

## 2018-09-17 MED ORDER — COCONUT OIL OIL: 1.0000 "application " | TOPICAL_OIL | Status: DC | PRN

## 2018-09-17 MED ORDER — DOCUSATE SODIUM 100 MG PO CAPS
100.0000 mg | ORAL_CAPSULE | Freq: Two times a day (BID) | ORAL | Status: DC
  Administered 2018-09-17 – 2018-09-18 (×2): 100 mg via ORAL
  Filled 2018-09-17 (×3): qty 1

## 2018-09-17 MED ORDER — BENZOCAINE-MENTHOL 20-0.5 % EX AERO
1.0000 "application " | INHALATION_SPRAY | CUTANEOUS | Status: DC | PRN
  Administered 2018-09-17: 1 via TOPICAL
  Filled 2018-09-17: qty 56

## 2018-09-17 MED ORDER — DIPHENHYDRAMINE HCL 25 MG PO CAPS: 25.0000 mg | ORAL_CAPSULE | Freq: Four times a day (QID) | ORAL | Status: DC | PRN

## 2018-09-17 MED ORDER — ZOLPIDEM TARTRATE 5 MG PO TABS: 5.0000 mg | ORAL_TABLET | Freq: Every evening | ORAL | Status: DC | PRN

## 2018-09-17 NOTE — Anesthesia Postprocedure Evaluation (Signed)
Anesthesia Post Note  Patient: Lauren Patel  Procedure(s) Performed: AN AD HOC LABOR EPIDURAL     Patient location during evaluation: Mother Baby Anesthesia Type: Epidural Level of consciousness: awake and alert, oriented and patient cooperative Pain management: pain level controlled Vital Signs Assessment: post-procedure vital signs reviewed and stable Respiratory status: spontaneous breathing Cardiovascular status: stable Postop Assessment: no headache, epidural receding, patient able to bend at knees, no signs of nausea or vomiting and able to ambulate Anesthetic complications: no Comments: Pt. Interviewed via phone d/t COVID-19 precautions.  Pt. States she has walked.  Pain score 4.     Last Vitals:  Vitals:   09/17/18 0536 09/17/18 0908  BP: 108/73 114/72  Pulse: 78 73  Resp: 18 16  Temp: 36.8 C 36.9 C  SpO2: 100% 99%    Last Pain:  Vitals:   09/17/18 1110  TempSrc:   PainSc: 3    Pain Goal:                   [facility]

## 2018-09-17 NOTE — Discharge Summary (Signed)
Obstetrics Discharge Summary OB/GYN Faculty Practice   Patient Name: Lauren CheekShayla M Patel DOB: 01/30/1996 MRN: [id number]  Date of admission: 09/16/2018 Delivering MD: Lauren Patel   Date of discharge: 09/18/2018  Admitting diagnosis: pregnancy Intrauterine pregnancy: 6124w0d     Secondary diagnosis:   Principal Problem:   Indication for care in labor and delivery, antepartum Active Problems:   Hx gestational diabetes   Late prenatal care   Anemia during pregnancy   Indication for care in labor or delivery     Discharge diagnosis: Term Pregnancy Delivered                                Postpartum procedures: None  Complications: None  Outpatient Follow-Up: [ ]  continue to counsel on method of contraception   Hospital course: Lauren Patel is a [age] y.o. 8224w0d who was admitted for spontaneous onset of labor. Her pregnancy was complicated by above noted items as well as history of postpartum hemorrhage. Her labor course was notable for admission in active labor, epidural placement, SROM. Delivery was uncomplicated. Please see delivery/op note for additional details. Her postpartum course was uncomplicated. She was bottlefeeding. By day of discharge, she was passing flatus, urinating, eating and drinking without difficulty. Her pain was well-controlled, and she was discharged home with ibuprofen. She will follow-up in clinic in 4-6 weeks.   Physical exam  Vitals:   09/17/18 1330 09/17/18 1759 09/17/18 2250 09/18/18 0653  BP: 101/66 98/66 (!) 91/57 106/72  Pulse: 72 67  63  Resp: 16 18 18 18   Temp: (!) 97.4 F (36.3 C) 98.4 F (36.9 C)  97.7 F (36.5 C)  TempSrc: Oral Oral  Oral  SpO2: 100%   96%  Weight:      Height:       General: well-appearing, NAD Lochia: appropriate Uterine Fundus: firm Incision: N/A DVT Evaluation: No significant calf/ankle edema.  Labs: Lab Results  Component Value Date   WBC 14.1 (H) 09/16/2018   HGB 12.1 09/16/2018   HCT 39.3  09/16/2018   MCV 88.9 09/16/2018   PLT 180 09/16/2018   CMP Latest Ref Rng & Units 03/05/2017  Glucose 65 - 99 mg/dL 96  BUN 7 - 20 mg/dL 12  Creatinine 4.090.50 - 8.111.00 mg/dL 9.140.82  Sodium 782135 - 956146 mmol/L 138  Potassium 3.8 - 5.1 mmol/L 4.0  Chloride 98 - 110 mmol/L 102  CO2 20 - 32 mmol/L 27  Calcium 8.9 - 10.4 mg/dL 8.9  Total Protein 6.3 - 8.2 g/dL 6.9  Total Bilirubin 0.2 - 1.1 mg/dL 0.3  Alkaline Phos 50 - 162 U/L -  AST 12 - 32 U/L 17  ALT 5 - 32 U/L 7    Discharge instructions: Per After Visit Summary and "Baby and Me Booklet"  After visit meds:  Allergies as of 09/18/2018   No Known Allergies     Medication List    STOP taking these medications   aspirin EC 81 MG tablet     TAKE these medications   acetaminophen 325 MG tablet Commonly known as:  Tylenol Take 2 tablets (650 mg total) by mouth every 4 (four) hours as needed (for pain scale < 4).   docusate sodium 100 MG capsule Commonly known as:  COLACE Take 100 mg by mouth 2 (two) times daily.   ferrous sulfate 325 (65 FE) MG EC tablet Take 325 mg by mouth daily with breakfast.  ibuprofen 800 MG tablet Commonly known as:  ADVIL Take 1 tablet (800 mg total) by mouth 3 (three) times daily.   PRENATAL 1 PO Take by mouth.       Postpartum contraception: Undecided Diet: Routine Diet Activity: Advance as tolerated. Pelvic rest for 6 weeks.   Follow-up Appt: Future Appointments  Date Time Provider Department Center  09/21/2018  8:30 AM [facility] 1 [facility] None  09/22/2018  2:00 PM Lauren Bossier, MD [facility]   Please schedule this patient for Postpartum visit in: 4 weeks with the following provider: Any provider Low risk pregnancy complicated by: hx PPH, hx GDM Delivery mode:  SVD Anticipated Birth Control:  other/unsure PP Procedures needed: none  Schedule Integrated BH visit: no  Newborn Data: Live born female  Birth Weight:  3929g APGAR: 8, 9  Newborn Delivery   Birth date/time:   09/17/2018 01:56:00 Delivery type:  Vaginal, Spontaneous    Baby Feeding: Bottle Disposition:home with mother   Cristal Deer. Earlene Plater, DO OB/GYN Fellow

## 2018-09-17 NOTE — Progress Notes (Signed)
OB/GYN Faculty Practice: Labor Progress Note  Subjective: Doing well, comfortable with epidural. Into room with RN because of variables. Had tried pushing with RN for about 30 minutes but then decided to labor down because minimal progress and not feeling as much pressure with contractions.   Objective: BP 112/69   Pulse 90   Temp 98.5 F (36.9 C) (Oral)   Resp 18   Ht 5\' 7"  (1.702 m)   Wt 84.7 kg   LMP 12/11/2017   SpO2 100%   BMI 29.26 kg/m  Gen: well-appearing, lying on side Dilation: 10 Dilation Complete Date: 09/16/18 Dilation Complete Time: 2230 Effacement (%): 100 Station: -1 Presentation: Vertex Exam by:: Grover Canavan, RN  Assessment and Plan: 22 y.o. G2P1001 [redacted]w[redacted]d here with SOL.   Labor: SOL, SROM 2322. Will labor down for another 15-20 minutes then start pushing again. Has been complete since 2330. -- pain control: epidural -- PPH Risk: medium (history of PPH requiring transfusions)  Fetal Well-Being: EFW 8-9lbs by Le[place]. Cephalic by sutures.  -- Category II - continuous fetal monitoring - improved to Category I with repositioning -- GBS negative    Viney Acocella S. Earlene Plater, DO OB/GYN Fellow, Faculty Practice  12:58 AM

## 2018-09-18 MED ORDER — IBUPROFEN 800 MG PO TABS: 800.0000 mg | ORAL_TABLET | Freq: Three times a day (TID) | ORAL | 0 refills | Status: DC

## 2018-09-18 MED ORDER — ACETAMINOPHEN 325 MG PO TABS: 650.0000 mg | ORAL_TABLET | ORAL | Status: DC | PRN

## 2018-09-19 ENCOUNTER — Encounter: Payer: Medicaid Other | Admitting: Obstetrics & Gynecology

## 2018-09-21 ENCOUNTER — Other Ambulatory Visit (HOSPITAL_COMMUNITY): Payer: Medicaid Other

## 2018-09-22 ENCOUNTER — Encounter: Payer: Medicaid Other | Admitting: Obstetrics & Gynecology

## 2018-09-23 ENCOUNTER — Inpatient Hospital Stay (HOSPITAL_COMMUNITY): Payer: 59

## 2018-10-17 ENCOUNTER — Other Ambulatory Visit: Payer: Self-pay

## 2018-10-17 ENCOUNTER — Encounter: Payer: Self-pay | Admitting: *Deleted

## 2018-10-17 ENCOUNTER — Encounter: Payer: Self-pay | Admitting: Obstetrics & Gynecology

## 2018-10-17 ENCOUNTER — Ambulatory Visit (INDEPENDENT_AMBULATORY_CARE_PROVIDER_SITE_OTHER): Payer: 59 | Admitting: Obstetrics & Gynecology

## 2018-10-17 MED ORDER — NORGESTREL-ETHINYL ESTRADIOL 0.3-30 MG-MCG PO TABS: 1.0000 | ORAL_TABLET | Freq: Every day | ORAL | 11 refills | Status: DC

## 2018-10-17 NOTE — Progress Notes (Signed)
Post Partum Exam  Lauren Patel is a 23 y.o. engaged G2P2002 (80 weeks and 12 yo sons) female who presents for a postpartum visit. She is 4 weeks postpartum following a spontaneous vaginal delivery. I have fully reviewed the prenatal and intrapartum course. The delivery was at 40 gestational weeks.  Anesthesia: epidural. Postpartum course has been unremarkable. Baby's course has been unremarkable. Baby is feeding by bottle - Fawn Kirk. Bleeding no bleeding. Bowel function is normal. Bladder function is normal. Patient is not sexually active. Contraception method is OCP (estrogen/progesterone). Postpartum depression screening:neg  Review of Systems Pertinent items are noted in HPI.   FH- + breast cancer in both GMs, no gyn or colon cancer  Objective:  Blood pressure 92/60, pulse 69, resp. rate 16, height 5\' 7"  (1.702 m), weight 166 lb (75.3 kg), not currently breastfeeding.  General:  alert   Breasts:  inspection negative, no nipple discharge or bleeding, no masses or nodularity palpable  Lungs: clear to auscultation bilaterally  Heart:  regular rate and rhythm, S1, S2 normal, no murmur, click, rub or gallop  Abdomen: soft, non-tender; bowel sounds normal; no masses,  no organomegaly                          Assessment:    Normal postpartum exam. Pap smear not done at today's visit.   Plan:   1. Contraception: OCP (estrogen/progesterone), lo ovral prescribed 2. rec back up method for 4 weeks, start today 3. Follow up in: 1 year for annual or as needed.  4. She had GDM with first pregnancy, not second one, but I have discussed her future risk of DM and rec'd weight loss to decrease that risk. Also rec get checked annually for DM

## 2018-12-06 ENCOUNTER — Encounter: Payer: Self-pay | Admitting: *Deleted

## 2019-10-07 IMAGING — US US MFM OB COMP +14 WKS
1 series · 13 of 28 positions shown · non-contrast
Comparison: none

[Series 1: us mfm ob comp +14 wks · 13 of 101 slices shown]
[im 4/101]
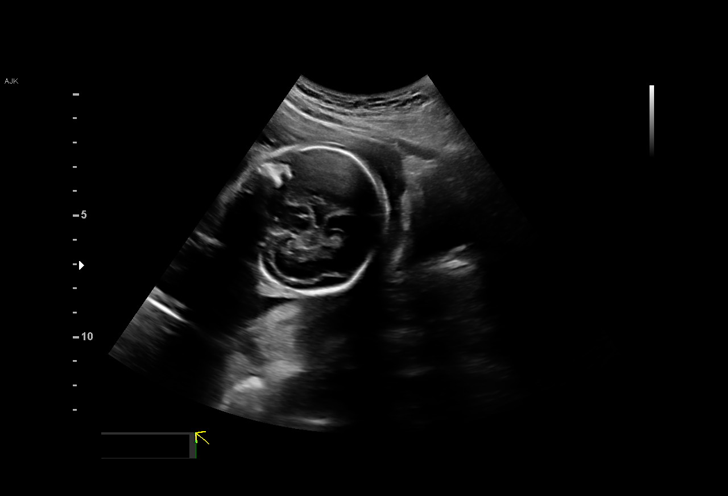
[im 12/101]
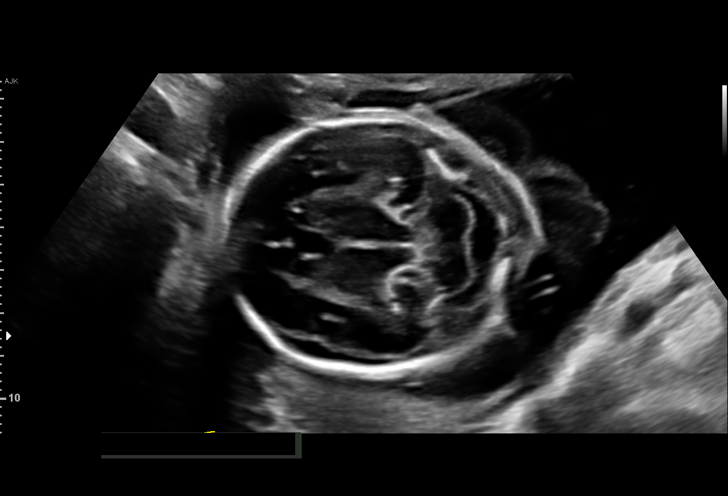
[im 19/101]
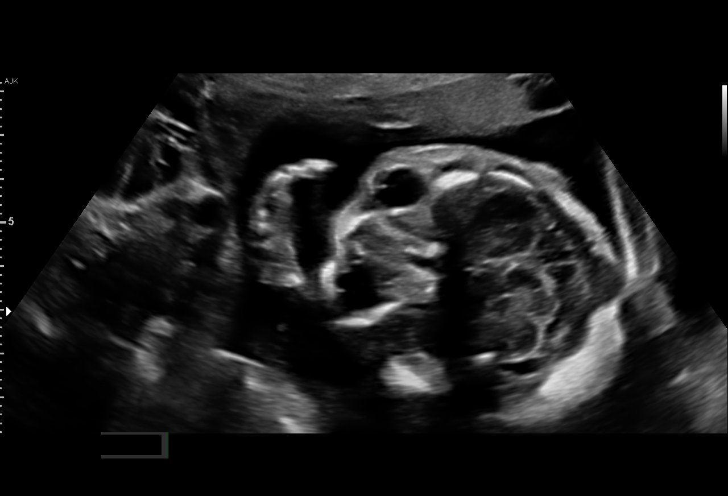
[im 26/101]
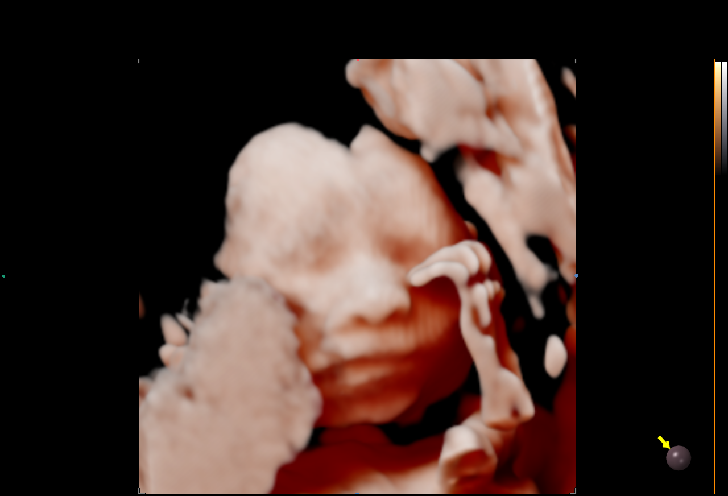
[im 34/101]
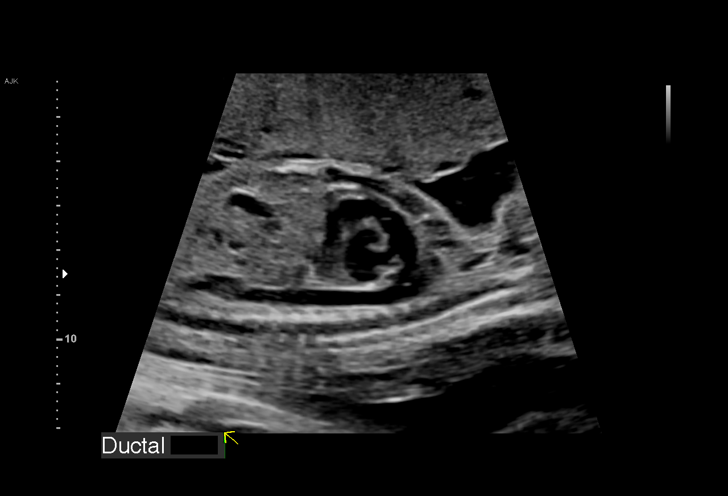
[im 41/101]
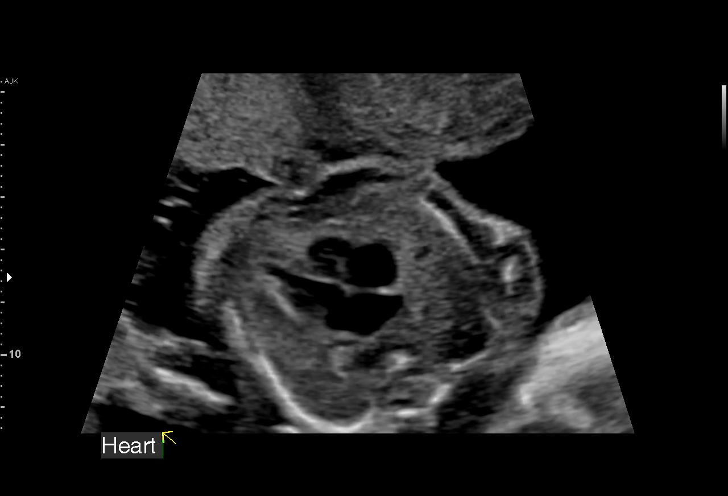
[im 52/101]
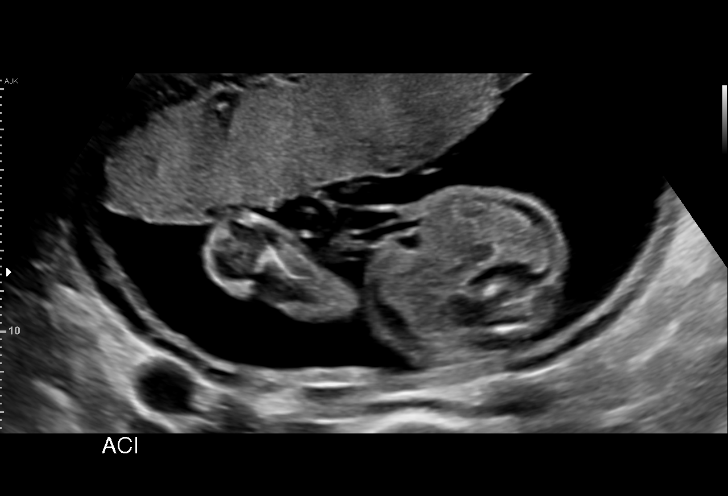
[im 60/101]
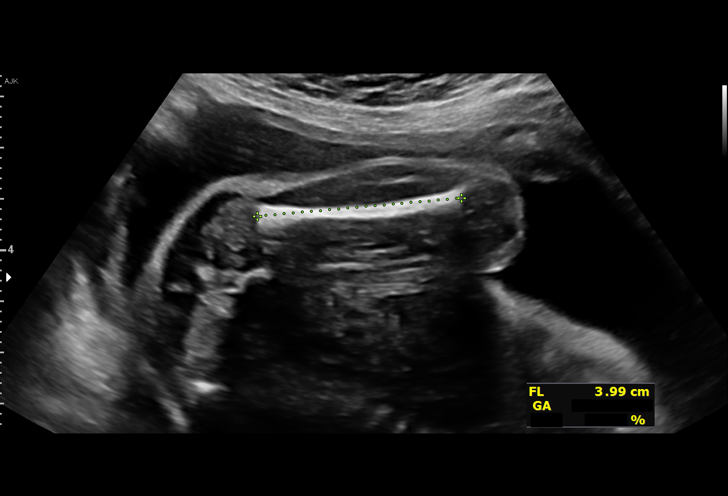
[im 67/101]
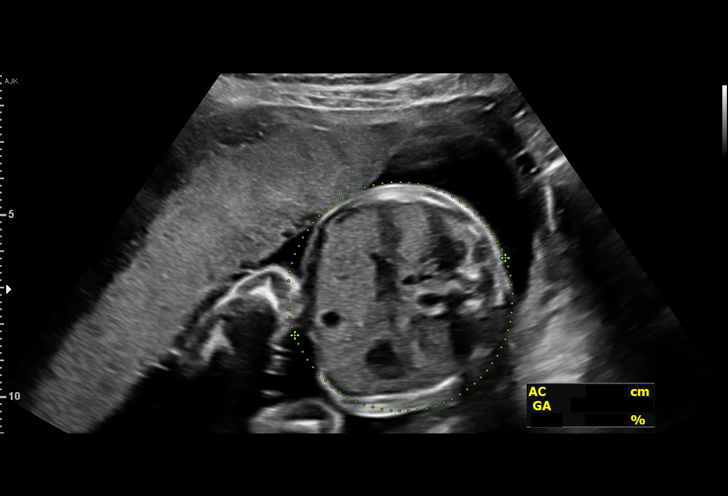
[im 75/101]
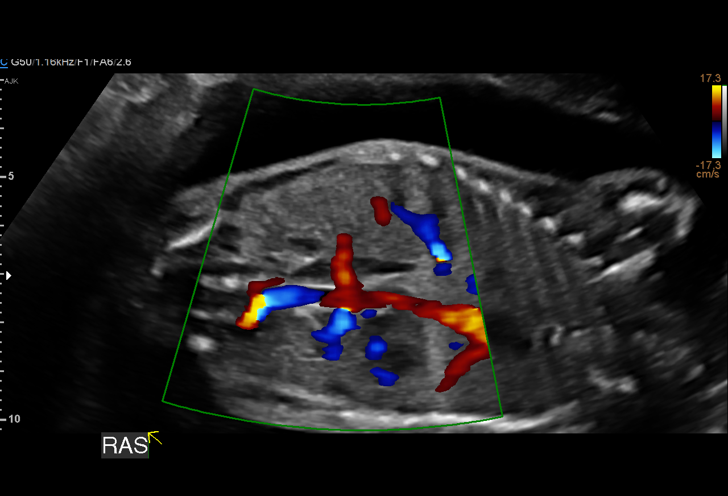
[im 82/101]
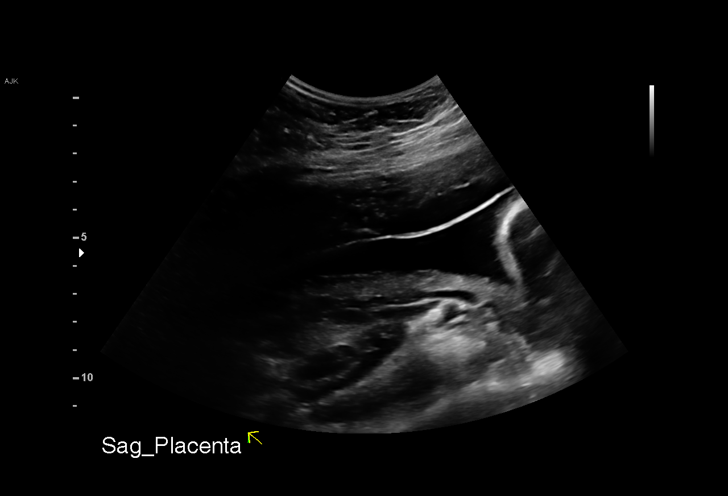
[im 89/101]
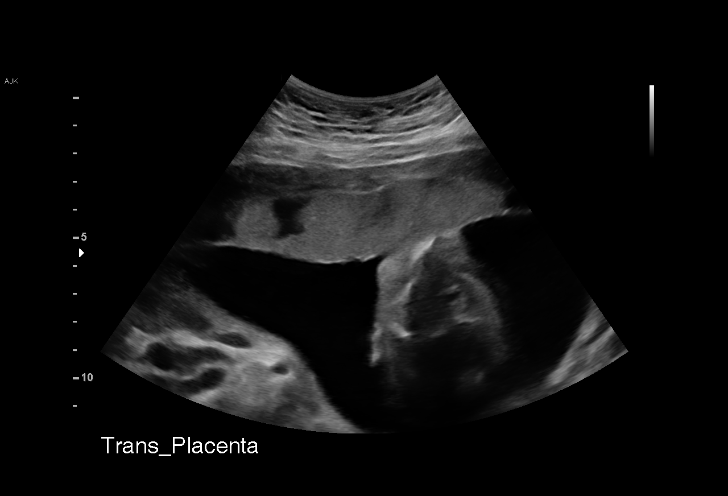
[im 97/101]
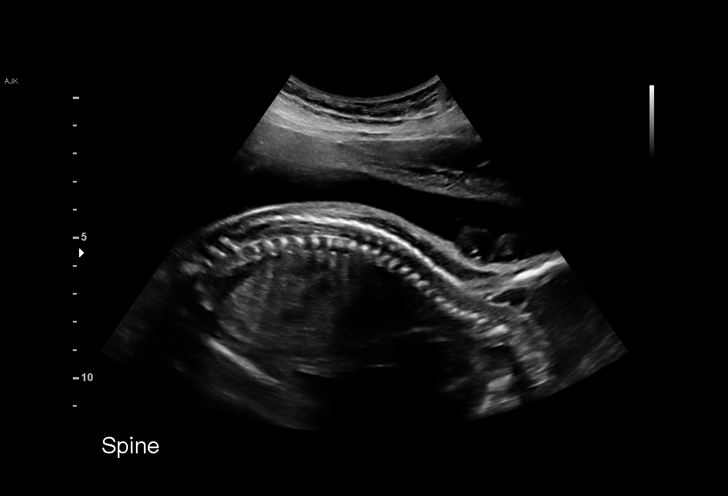

[13 of 28 positions shown; findings below may reference images not displayed]

DEYTER

                   BEISEL NP

 ----------------------------------------------------------------------

 ----------------------------------------------------------------------
Indications

  23 weeks gestation of pregnancy
  Encounter for antenatal screening for
  malformations
  Late prenatal care, second trimester
 ----------------------------------------------------------------------
Vital Signs

 BMI:
Fetal Evaluation

 Num Of Fetuses:          1
 Fetal Heart Rate(bpm):   145
 Cardiac Activity:        Observed
 Presentation:            Cephalic
 Placenta:                Anterior
 P. Cord Insertion:       Visualized, central

 Amniotic Fluid
 AFI FV:      Within normal limits

                             Largest Pocket(cm)

Biometry

 BPD:      58.5  mm     G. Age:  24w 0d         70  %    CI:        76.06   %    70 - 86
                                                         FL/HC:       18.9  %    19.2 -
 HC:      212.6  mm     G. Age:  23w 3d         36  %    HC/AC:       1.05       1.05 -
 AC:      201.8  mm     G. Age:  24w 5d         84  %    FL/BPD:      68.5  %    71 - 87
 FL:       40.1  mm     G. Age:  23w 0d         28  %    FL/AC:       19.9  %    20 - 24
 HUM:      37.4  mm     G. Age:  23w 1d         39  %
 CER:      24.3  mm     G. Age:  22w 2d         34  %

 LV:        6.6  mm

 Est. FW:     644   gm     1 lb 7 oz     63  %
OB History

 Gravidity:    2         Term:   1        Prem:   0        SAB:   0
 TOP:          0       Ectopic:  0        Living: 1
Gestational Age

 LMP:           23w 2d        Date:  12/11/17                 EDD:   09/17/18
 U/S Today:     23w 6d                                        EDD:   09/13/18
 Best:          23w 2d     Det. By:  LMP  (12/11/17)          EDD:   09/17/18
Anatomy

 Cranium:               Appears normal         Aortic Arch:            Appears normal
 Cavum:                 Appears normal         Ductal Arch:            Appears normal
 Ventricles:            Appears normal         Diaphragm:              Appears normal
 Choroid Plexus:        Appears normal         Stomach:                Appears normal, left
                                                                       sided
 Cerebellum:            Appears normal         Abdomen:                Appears normal
 Posterior Fossa:       Appears normal         Abdominal Wall:         Appears nml (cord
                                                                       insert, abd wall)
 Nuchal Fold:           Appears normal         Cord Vessels:           Appears normal (3
                                                                       vessel cord)
 Face:                  Appears normal         Kidneys:                Appear normal
                        (orbits and profile)
 Lips:                  Appears normal         Bladder:                Appears normal
 Thoracic:              Appears normal         Spine:                  Appears normal
 Heart:                 Appears normal         Upper Extremities:      Appears normal
                        (4CH, axis, and
                        situs)
 RVOT:                  Appears normal         Lower Extremities:      Appears normal
 LVOT:                  Appears normal

 Other:  Fetus appears to be a male. Heels and 5th digit visualized. Nasal
         bone visualized. Open hands visualized.
Cervix Uterus Adnexa

 Cervix
 Length:           3.11  cm.
 Normal appearance by transabdominal scan.

 Uterus
 No abnormality visualized.

 Left Ovary
 Not visualized.

 Right Ovary
 Within normal limits.

 Adnexa
 No abnormality visualized.
Impression

 Normal interval growth.  No ultrasonic evidence of structural
 fetal anomalies.
Recommendations

 Follow up as clinically indicated.

## 2021-12-03 ENCOUNTER — Ambulatory Visit (INDEPENDENT_AMBULATORY_CARE_PROVIDER_SITE_OTHER): Payer: Medicaid Other | Admitting: Emergency Medicine

## 2021-12-03 VITALS — BP 106/71 | Ht 67.0 in | Wt 151.5 lb

## 2021-12-03 DIAGNOSIS — Z3201 Encounter for pregnancy test, result positive: Secondary | ICD-10-CM

## 2021-12-03 NOTE — Progress Notes (Signed)
Lauren Patel presents today for UPT. She has no unusual complaints and complains of fatigue, nausea, and low back pain.  LMP: 10/13/2021    OBJECTIVE: Appears well, in no apparent distress.  OB History     Gravida  2   Para  2   Term  2   Preterm      AB      Living  2      SAB      IAB      Ectopic      Multiple  0   Live Births  2          Home UPT Result: Positive In-Office UPT result: Positive I have reviewed the patient's medical, obstetrical, social, and family histories, and medications.   ASSESSMENT: Positive pregnancy test  PLAN Prenatal care to be completed at: [facility] or [place]

## 2021-12-24 ENCOUNTER — Encounter: Payer: Self-pay | Admitting: *Deleted

## 2021-12-24 DIAGNOSIS — Z3490 Encounter for supervision of normal pregnancy, unspecified, unspecified trimester: Secondary | ICD-10-CM | POA: Insufficient documentation

## 2021-12-26 ENCOUNTER — Encounter: Payer: Medicaid Other | Admitting: Certified Nurse Midwife

## 2021-12-29 NOTE — Progress Notes (Signed)
No show  This encounter was created in error - please disregard.

## 2022-01-13 ENCOUNTER — Other Ambulatory Visit (HOSPITAL_COMMUNITY)
Admission: RE | Admit: 2022-01-13 | Discharge: 2022-01-13 | Disposition: A | Discharge status: Home / Self Care | Payer: Medicaid Other | Source: Ambulatory Visit | Attending: Obstetrics and Gynecology | Admitting: Obstetrics and Gynecology

## 2022-01-13 ENCOUNTER — Ambulatory Visit (INDEPENDENT_AMBULATORY_CARE_PROVIDER_SITE_OTHER): Payer: Medicaid Other

## 2022-01-13 ENCOUNTER — Ambulatory Visit (INDEPENDENT_AMBULATORY_CARE_PROVIDER_SITE_OTHER): Payer: Medicaid Other | Admitting: Obstetrics and Gynecology

## 2022-01-13 ENCOUNTER — Encounter: Payer: Self-pay | Admitting: Obstetrics and Gynecology

## 2022-01-13 VITALS — BP 110/79 | HR 80 | Wt 153.0 lb

## 2022-01-13 DIAGNOSIS — Z3491 Encounter for supervision of normal pregnancy, unspecified, first trimester: Secondary | ICD-10-CM | POA: Diagnosis not present

## 2022-01-13 DIAGNOSIS — Z3A13 13 weeks gestation of pregnancy: Secondary | ICD-10-CM | POA: Diagnosis not present

## 2022-01-13 DIAGNOSIS — Z3490 Encounter for supervision of normal pregnancy, unspecified, unspecified trimester: Secondary | ICD-10-CM

## 2022-01-13 NOTE — Progress Notes (Unsigned)
History:   Lauren Patel is a 26 y.o. G3P2002 at [redacted]w[redacted]d by LMP being seen today for her first obstetrical visit.  Her obstetrical history is significant for {ob risk factors:10154}. Patient {does/does not:19097} intend to breast feed. Pregnancy history fully reviewed.  Ist pregnancy with GDM, and postpartum hemorrhage.  2nd pregnancy no issues.   Patient reports nausea.      HISTORY: OB History  Gravida Para Term Preterm AB Living  3 2 2  0 0 2  SAB IAB Ectopic Multiple Live Births  0 0 0 0 2    # Outcome Date GA Lbr Len/2nd Weight Sex Delivery Anes PTL Lv  3 Current           2 Term 09/17/18 [redacted]w[redacted]d 04:30 / 03:26 8 lb 10.6 oz (3.929 kg) M Vag-Spont EPI  LIV     Name: Lauren, Patel     Apgar1: 8  Apgar5: 9  1 Term 05/06/16     Vag-Vacuum   LIV     Birth Comments: PPH with transfusion    Last pap smear was done *** and was {Normal/Abnormal Appearance:21344::"normal"}  Past Medical History:  Diagnosis Date   Anemia    Gestational diabetes    first child   Past Surgical History:  Procedure Laterality Date   MOUTH SURGERY     Family History  Problem Relation Age of Onset   Asthma Mother    Diabetes Maternal Grandmother    Breast cancer Maternal Grandmother    Hypertension Maternal Grandmother    Asthma Maternal Grandmother    Breast cancer Paternal Grandmother    Social History   Tobacco Use   Smoking status: Passive Smoke Exposure - Never Smoker   Smokeless tobacco: Never  Vaping Use   Vaping Use: Never used  Substance Use Topics   Alcohol use: No   Drug use: No   No Known Allergies Current Outpatient Medications on File Prior to Visit  Medication Sig Dispense Refill   Prenatal MV-Min-Fe Fum-FA-DHA (PRENATAL 1 PO) Take by mouth.     No current facility-administered medications on file prior to visit.    Review of Systems Pertinent items noted in HPI and remainder of comprehensive ROS otherwise negative.  Indications for ASA therapy  (per UpToDate) One of the following: Previous pregnancy with preeclampsia, especially early onset and with an adverse outcome {yes/no:20286} Multifetal gestation {yes/no:20286} Chronic hypertension {yes/no:20286} Type 1 or 2 diabetes mellitus {yes/no:20286} Chronic kidney disease {yes/no:20286} Autoimmune disease (antiphospholipid syndrome, systemic lupus erythematosus) {yes/no:20286} Two or more of the following: Nulliparity {yes/no:20286} Obesity (body mass index >30 kg/m2) {yes/no:20286} Family history of preeclampsia in mother or sister {yes/no:20286} Age ?35 years {yes/no:20286} Sociodemographic characteristics (African American race, low socioeconomic level) {yes/no:20286} Personal risk factors (eg, previous pregnancy with low birth weight or small for gestational age infant, previous adverse pregnancy outcome [eg, stillbirth], interval >10 years between pregnancies) {yes/no:20286}  Indications for early GDM screening (FBS, A1C, Random CBG, GTT) First-degree relative with diabetes {yes/no:20286} BMI >30kg/m2 {yes/no:20286} Age > 35 {yes/no:20286} Previous birth of an infant weighing ?4000 g {yes/no:20286} Gestational diabetes mellitus in a previous pregnancy {yes/no:20286} Glycated hemoglobin ?5.7 percent (39 mmol/mol), impaired glucose tolerance, or impaired fasting glucose on previous testing {yes/no:20286} High-risk race/ethnicity (eg, African American, Latino, Native American, 07/04/16 American, [place] Islander) {yes/no:20286} Previous stillbirth of unknown cause {yes/no:20286} Maternal birthweight > 9 lbs {yes/no:20286} History of cardiovascular disease {yes/no:20286} Hypertension or on therapy for hypertension {yes/no:20286} High-density lipoprotein cholesterol level <35 mg/dL ([place] mmol/L) and/or  a triglyceride level >250 mg/dL (2.59 mmol/L) {DGL/OV:56433} Polycystic ovary syndrome {yes/no:20286} Physical inactivity {yes/no:20286} Other clinical condition associated with  insulin resistance (eg, severe obesity, acanthosis nigricans) {yes/no:20286} Current use of glucocorticoids {yes/no:20286}   Physical Exam:   Vitals:   01/13/22 1409  BP: 110/79  Pulse: 80  Weight: 153 lb (69.4 kg)   Patient informed that the ultrasound is considered a limited obstetric ultrasound and is not intended to be a complete ultrasound exam.  Patient also informed that the ultrasound is not being completed with the intent of assessing for fetal or placental anomalies or any pelvic abnormalities.  Explained that the purpose of today's ultrasound is to assess for fetal heart rate.  Patient acknowledges the purpose of the exam and the limitations of the study. General: well-developed, well-nourished female in no acute distress  Breasts:  normal appearance, no masses or tenderness bilaterally, exam done in the presence of a chaperone.   Skin: normal coloration and turgor, no rashes  Neurologic: oriented, normal, negative, normal mood  Extremities: normal strength, tone, and muscle mass, ROM of all joints is normal  HEENT PERRLA, extraocular movement intact and sclera clear, anicteric  Neck supple and no masses  Cardiovascular: regular rate and rhythm  Respiratory:  no respiratory distress, normal breath sounds  Abdomen: soft, non-tender; bowel sounds normal; no masses,  no organomegaly  Pelvic: normal external genitalia, no lesions, normal vaginal mucosa, normal vaginal discharge, normal cervix, ***pap smear done. Exam done in the presence of a chaperone.     Assessment:    Pregnancy: [id number] Patient Active Problem List   Diagnosis Date Noted   Supervision of normal pregnancy 12/24/2021   Hx gestational diabetes 05/18/2018     Plan:    1. Encounter for supervision of normal pregnancy, antepartum, unspecified gravidity *** - US OB Limited; Future - Cytology - PAP( [place]) - Obstetric panel - HIV antibody (with reflex) - Hepatitis C Antibody - Culture, OB Urine -  Panorama Prenatal Test Full Panel - Babyscripts Schedule Optimization   Initial labs drawn. Continue prenatal vitamins. Problem list reviewed and updated. Genetic Screening discussed, {Blank multiple:19196::"Panorama","Horizon"}: {requests/ordered/declines:14581}. Ultrasound discussed; fetal anatomic survey: {Planned/scheduled/na:14534}. Anticipatory guidance about prenatal visits given including labs, ultrasounds, and testing. Weight gain recommendations per IOM guidelines reviewed: underweight/BMI 18.5 or less > 28 - 40 lbs; normal weight/BMI 18.5 - 24.9 > 25 - 35 lbs; overweight/BMI 25 - 29.9 > 15 - 25 lbs; obese/BMI  30 or more > 11 - 20 lbs. Discussed usage of the Babyscripts app for more information about pregnancy, and to track blood pressures. Also discussed usage of virtual visits as additional source of managing and completing prenatal visits.  Patient was encouraged to use MyChart to review results, send requests, and have questions addressed.   The nature of [place] [facility] Healthcare/Faculty Practice with multiple MDs and Advanced Practice Providers was explained to patient; also emphasized that residents, students are part of our team. Routine obstetric precautions reviewed. Encouraged to seek out care at our office or emergency room [facility] preferred) for urgent and/or emergent concerns. No follow-ups on file.     Jaynie Collins, MD, FACOG Obstetrician & Gynecologist, [facility] for [employer], [facility]

## 2022-01-14 LAB — OBSTETRIC PANEL

## 2022-01-14 LAB — HEMOGLOBIN A1C: Mean Plasma Glucose: 105 mg/dL

## 2022-01-14 MED ORDER — DOXYLAMINE-PYRIDOXINE 10-10 MG PO TBEC: 2.0000 | DELAYED_RELEASE_TABLET | Freq: Every day | ORAL | 0 refills | Status: DC

## 2022-01-15 LAB — HIV ANTIBODY (ROUTINE TESTING W REFLEX): HIV 1&2 Ab, 4th Generation: NONREACTIVE

## 2022-01-15 LAB — OBSTETRIC PANEL
Absolute Monocytes: 331 cells/uL (ref 200–950)
Antibody Screen: NOT DETECTED
Basophils Absolute: 39 cells/uL (ref 0–200)
Basophils Relative: 0.5 %
Eosinophils Absolute: 39 cells/uL (ref 15–500)
Eosinophils Relative: 0.5 %
HCT: 31.3 % — ABNORMAL LOW (ref 35.0–45.0)
Hepatitis B Surface Ag: NONREACTIVE
Lymphs Abs: 1748 cells/uL (ref 850–3900)
MCH: 27.4 pg (ref 27.0–33.0)
MCHC: 33.2 g/dL (ref 32.0–36.0)
MCV: 82.6 fL (ref 80.0–100.0)
MPV: 10.9 fL (ref 7.5–12.5)
Monocytes Relative: 4.3 %
Neutro Abs: 5544 cells/uL (ref 1500–7800)
Neutrophils Relative %: 72 %
Platelets: 225 10*3/uL (ref 140–400)
RBC: 3.79 10*6/uL — ABNORMAL LOW (ref 3.80–5.10)
RDW: 12.9 % (ref 11.0–15.0)
RPR Ser Ql: NONREACTIVE
Rubella: 1.51 Index
Total Lymphocyte: 22.7 %
WBC: 7.7 10*3/uL (ref 3.8–10.8)

## 2022-01-15 LAB — HEPATITIS C ANTIBODY: Hepatitis C Ab: NONREACTIVE

## 2022-01-15 LAB — HEMOGLOBIN A1C
Hgb A1c MFr Bld: 5.3 % of total Hgb (ref <5.7)
eAG (mmol/L): 5.8 mmol/L

## 2022-01-15 LAB — CULTURE, OB URINE

## 2022-01-15 LAB — URINE CULTURE, OB REFLEX

## 2022-01-16 LAB — CYTOLOGY - PAP
Chlamydia: NEGATIVE
Comment: NEGATIVE
Comment: NORMAL
Diagnosis: NEGATIVE
Neisseria Gonorrhea: NEGATIVE

## 2022-02-03 ENCOUNTER — Ambulatory Visit (INDEPENDENT_AMBULATORY_CARE_PROVIDER_SITE_OTHER): Payer: Medicaid Other

## 2022-02-03 VITALS — BP 100/63 | HR 77 | Wt 153.0 lb

## 2022-02-03 DIAGNOSIS — Z23 Encounter for immunization: Secondary | ICD-10-CM | POA: Diagnosis not present

## 2022-02-03 DIAGNOSIS — Z3492 Encounter for supervision of normal pregnancy, unspecified, second trimester: Secondary | ICD-10-CM | POA: Diagnosis not present

## 2022-02-03 DIAGNOSIS — Z3A16 16 weeks gestation of pregnancy: Secondary | ICD-10-CM

## 2022-02-03 DIAGNOSIS — Z3490 Encounter for supervision of normal pregnancy, unspecified, unspecified trimester: Secondary | ICD-10-CM

## 2022-02-03 NOTE — Progress Notes (Signed)
   PRENATAL VISIT NOTE  Subjective:  Lauren Patel is a 26 y.o. G3P2002 at [redacted]w[redacted]d being seen today for ongoing prenatal care.  She is currently monitored for the following issues for this low-risk pregnancy and has Hx gestational diabetes and Supervision of normal pregnancy on their problem list.  Patient reports dark, malodorous urine x 1-1.5 weeks. No burning or pain with urination. No hematuria, fever, or chills. Occasional pressure, but not consistent.  Contractions: Not present. Vag. Bleeding: None.  Movement: Absent. Denies leaking of fluid.   The following portions of the patient's history were reviewed and updated as appropriate: allergies, current medications, past family history, past medical history, past social history, past surgical history and problem list.   Objective:   Vitals:   02/03/22 1602  BP: 100/63  Pulse: 77  Weight: 153 lb (69.4 kg)    Fetal Status: Fetal Heart Rate (bpm): 146   Movement: Absent     General:  Alert, oriented and cooperative. Patient is in no acute distress.  Skin: Skin is warm and dry. No rash noted.   Cardiovascular: Normal heart rate noted  Respiratory: Normal respiratory effort, no problems with respiration noted  Abdomen: Soft, gravid, appropriate for gestational age.  Pain/Pressure: Absent     Pelvic: Cervical exam deferred        Extremities: Normal range of motion.  Edema: None  Mental Status: Normal mood and affect. Normal behavior. Normal judgment and thought content.   Assessment and Plan:  Pregnancy: G3P2002 at [redacted]w[redacted]d 1. Encounter for supervision of normal pregnancy, antepartum, unspecified gravidity - Routine OB. Doing well - Nausea and appetite improving - Will send urine for culture - Flu shot today  - Flu Vaccine QUAD 70mo+IM (Fluarix, Fluzone & Alfiuria Quad PF) - Urine Culture  2. [redacted] weeks gestation of pregnancy   Preterm labor symptoms and general obstetric precautions including but not limited to vaginal  bleeding, contractions, leaking of fluid and fetal movement were reviewed in detail with the patient. Please refer to After Visit Summary for other counseling recommendations.   Return in about 4 weeks (around 03/03/2022).  Future Appointments  Date Time Provider [place]  02/23/2022 10:15 AM [facility] NURSE [facility]  02/23/2022 10:30 AM [facility] US3 [facility]  03/03/2022  1:10 PM Rasch, Artist Pais, NP [facility]    Renee Harder, CNM

## 2022-02-05 LAB — URINE CULTURE
MICRO NUMBER:: 14005206
Result:: NO GROWTH
SPECIMEN QUALITY:: ADEQUATE

## 2022-02-23 ENCOUNTER — Other Ambulatory Visit: Payer: Self-pay | Admitting: *Deleted

## 2022-02-23 ENCOUNTER — Encounter: Payer: Self-pay | Admitting: *Deleted

## 2022-02-23 ENCOUNTER — Ambulatory Visit: Payer: Medicaid Other | Attending: Obstetrics and Gynecology

## 2022-02-23 ENCOUNTER — Ambulatory Visit: Payer: Medicaid Other | Admitting: *Deleted

## 2022-02-23 VITALS — BP 110/61 | HR 66

## 2022-02-23 DIAGNOSIS — Z3689 Encounter for other specified antenatal screening: Secondary | ICD-10-CM | POA: Insufficient documentation

## 2022-02-23 DIAGNOSIS — Z3490 Encounter for supervision of normal pregnancy, unspecified, unspecified trimester: Secondary | ICD-10-CM | POA: Diagnosis not present

## 2022-02-23 DIAGNOSIS — Z362 Encounter for other antenatal screening follow-up: Secondary | ICD-10-CM

## 2022-02-23 DIAGNOSIS — O09292 Supervision of pregnancy with other poor reproductive or obstetric history, second trimester: Secondary | ICD-10-CM | POA: Diagnosis not present

## 2022-02-23 DIAGNOSIS — Z363 Encounter for antenatal screening for malformations: Secondary | ICD-10-CM | POA: Diagnosis present

## 2022-02-23 DIAGNOSIS — O09299 Supervision of pregnancy with other poor reproductive or obstetric history, unspecified trimester: Secondary | ICD-10-CM

## 2022-02-23 DIAGNOSIS — Z3A19 19 weeks gestation of pregnancy: Secondary | ICD-10-CM | POA: Insufficient documentation

## 2022-03-03 ENCOUNTER — Encounter: Payer: Medicaid Other | Admitting: Obstetrics and Gynecology

## 2022-03-04 NOTE — Progress Notes (Unsigned)
   PRENATAL VISIT NOTE  Subjective:  Lauren Patel is a 26 y.o. G3P2002 at [redacted]w[redacted]d being seen today for ongoing prenatal care.  She is currently monitored for the following issues for this low-risk pregnancy and has Hx gestational diabetes and Supervision of normal pregnancy on their problem list.  Patient reports {sx:14538}.   .  .   . Denies leaking of fluid.   The following portions of the patient's history were reviewed and updated as appropriate: allergies, current medications, past family history, past medical history, past social history, past surgical history and problem list.   Objective:  There were no vitals filed for this visit.  Fetal Status:           General:  Alert, oriented and cooperative. Patient is in no acute distress.  Skin: Skin is warm and dry. No rash noted.   Cardiovascular: Normal heart rate noted  Respiratory: Normal respiratory effort, no problems with respiration noted  Abdomen: Soft, gravid, appropriate for gestational age.        Pelvic: Cervical exam deferred        Extremities: Normal range of motion.     Mental Status: Normal mood and affect. Normal behavior. Normal judgment and thought content.   Assessment and Plan:  Pregnancy: G3P2002 at [redacted]w[redacted]d 1. Encounter for supervision of normal pregnancy, antepartum, unspecified gravidity Measuring ahead by one week. Has f/u for growth on 12/4 and completion of anatomy.  Offered MSAFP - pt ***   2. Hx gestational diabetes Early A1C was 5.3.    Preterm labor symptoms and general obstetric precautions including but not limited to vaginal bleeding, contractions, leaking of fluid and fetal movement were reviewed in detail with the patient. Please refer to After Visit Summary for other counseling recommendations.   No follow-ups on file.  Future Appointments  Date Time Provider [place]  03/05/2022  1:30 PM Radene Gunning, MD [facility]  04/06/2022  8:30 AM [facility] NURSE [facility]  [facility]  04/06/2022  8:45 AM [facility] US4 [facility] [place]    Radene Gunning, MD

## 2022-03-05 ENCOUNTER — Ambulatory Visit (INDEPENDENT_AMBULATORY_CARE_PROVIDER_SITE_OTHER): Payer: Medicaid Other | Admitting: Obstetrics and Gynecology

## 2022-03-05 ENCOUNTER — Encounter: Payer: Self-pay | Admitting: Obstetrics and Gynecology

## 2022-03-05 VITALS — BP 101/64 | HR 74 | Wt 162.0 lb

## 2022-03-05 DIAGNOSIS — Z3A20 20 weeks gestation of pregnancy: Secondary | ICD-10-CM

## 2022-03-05 DIAGNOSIS — Z8632 Personal history of gestational diabetes: Secondary | ICD-10-CM

## 2022-03-05 DIAGNOSIS — Z3482 Encounter for supervision of other normal pregnancy, second trimester: Secondary | ICD-10-CM

## 2022-03-05 DIAGNOSIS — Z3490 Encounter for supervision of normal pregnancy, unspecified, unspecified trimester: Secondary | ICD-10-CM

## 2022-03-09 LAB — ALPHA FETOPROTEIN, MATERNAL
AFP MoM: 1.63
AFP, Serum: 95.8 ng/mL
Calc'd Gestational Age: 20.4 weeks
Maternal Wt: 162 [lb_av]
Risk for ONTD: 1
Twins-AFP: 1

## 2022-04-03 ENCOUNTER — Ambulatory Visit (INDEPENDENT_AMBULATORY_CARE_PROVIDER_SITE_OTHER): Payer: Medicaid Other | Admitting: Certified Nurse Midwife

## 2022-04-03 VITALS — BP 106/68 | HR 82 | Wt 168.0 lb

## 2022-04-03 DIAGNOSIS — Z3A24 24 weeks gestation of pregnancy: Secondary | ICD-10-CM

## 2022-04-03 DIAGNOSIS — Z3482 Encounter for supervision of other normal pregnancy, second trimester: Secondary | ICD-10-CM

## 2022-04-03 DIAGNOSIS — Z8759 Personal history of other complications of pregnancy, childbirth and the puerperium: Secondary | ICD-10-CM | POA: Insufficient documentation

## 2022-04-03 NOTE — Progress Notes (Signed)
Pt states sometimes she has difficulty breathing

## 2022-04-03 NOTE — Progress Notes (Signed)
Subjective:  Lauren Patel is a 26 y.o. G3P2002 at [redacted]w[redacted]d being seen today for ongoing prenatal care.  She is currently monitored for the following issues for this low-risk pregnancy and has Hx gestational diabetes; Supervision of normal pregnancy; and History of postpartum hemorrhage on their problem list.  Patient reports  reports occasionally feeling out of breath like she was just running . Denies CP.  Contractions: Not present. Vag. Bleeding: None.  Movement: Present. Denies leaking of fluid.   The following portions of the patient's history were reviewed and updated as appropriate: allergies, current medications, past family history, past medical history, past social history, past surgical history and problem list. Problem list updated.  Objective:   Vitals:   04/03/22 0834  BP: 106/68  Pulse: 82  Weight: 168 lb (76.2 kg)    Fetal Status: Fetal Heart Rate (bpm): 143 Fundal Height: 24 cm Movement: Present     General:  Alert, oriented and cooperative. Patient is in no acute distress.  Skin: Skin is warm and dry. No rash noted.   Cardiovascular: Normal heart rate noted  Respiratory: Normal respiratory effort, no problems with respiration noted  Abdomen: Soft, gravid, appropriate for gestational age. Pain/Pressure: Absent     Pelvic: Vag. Bleeding: None Vag D/C Character: Thin   Cervical exam deferred        Extremities: Normal range of motion.  Edema: None  Mental Status: Normal mood and affect. Normal behavior. Normal judgment and thought content.   Urinalysis:      Assessment and Plan:  Pregnancy: G3P2002 at [redacted]w[redacted]d  1. Encounter for supervision of other normal pregnancy in second trimester  2. History of postpartum hemorrhage  3. [redacted] weeks gestation of pregnancy - discussed normal pregnancy sx including feeling winded at times; warning signs reviewed.   Preterm labor symptoms and general obstetric precautions including but not limited to vaginal bleeding,  contractions, leaking of fluid and fetal movement were reviewed in detail with the patient. Please refer to After Visit Summary for other counseling recommendations.  Return in about 4 weeks (around 05/01/2022).   Donette Larry, CNM

## 2022-04-06 ENCOUNTER — Encounter: Payer: Self-pay | Admitting: *Deleted

## 2022-04-06 ENCOUNTER — Ambulatory Visit: Payer: 59 | Admitting: *Deleted

## 2022-04-06 ENCOUNTER — Ambulatory Visit: Payer: 59 | Attending: Obstetrics and Gynecology

## 2022-04-06 VITALS — BP 107/69 | HR 84

## 2022-04-06 DIAGNOSIS — Z369 Encounter for antenatal screening, unspecified: Secondary | ICD-10-CM | POA: Insufficient documentation

## 2022-04-06 DIAGNOSIS — Z362 Encounter for other antenatal screening follow-up: Secondary | ICD-10-CM | POA: Diagnosis not present

## 2022-04-06 DIAGNOSIS — Z3482 Encounter for supervision of other normal pregnancy, second trimester: Secondary | ICD-10-CM | POA: Insufficient documentation

## 2022-04-06 DIAGNOSIS — O09293 Supervision of pregnancy with other poor reproductive or obstetric history, third trimester: Secondary | ICD-10-CM

## 2022-04-06 DIAGNOSIS — Z3A25 25 weeks gestation of pregnancy: Secondary | ICD-10-CM | POA: Insufficient documentation

## 2022-04-06 DIAGNOSIS — Z8759 Personal history of other complications of pregnancy, childbirth and the puerperium: Secondary | ICD-10-CM | POA: Insufficient documentation

## 2022-04-06 DIAGNOSIS — O09292 Supervision of pregnancy with other poor reproductive or obstetric history, second trimester: Secondary | ICD-10-CM | POA: Diagnosis not present

## 2022-04-06 DIAGNOSIS — O09299 Supervision of pregnancy with other poor reproductive or obstetric history, unspecified trimester: Secondary | ICD-10-CM | POA: Insufficient documentation

## 2022-04-06 DIAGNOSIS — Z8632 Personal history of gestational diabetes: Secondary | ICD-10-CM | POA: Insufficient documentation

## 2022-04-30 ENCOUNTER — Ambulatory Visit (INDEPENDENT_AMBULATORY_CARE_PROVIDER_SITE_OTHER): Payer: 59 | Admitting: Obstetrics and Gynecology

## 2022-04-30 VITALS — BP 107/69 | HR 97 | Wt 174.0 lb

## 2022-04-30 DIAGNOSIS — Z23 Encounter for immunization: Secondary | ICD-10-CM | POA: Diagnosis not present

## 2022-04-30 DIAGNOSIS — Z3483 Encounter for supervision of other normal pregnancy, third trimester: Secondary | ICD-10-CM

## 2022-04-30 DIAGNOSIS — Z3A28 28 weeks gestation of pregnancy: Secondary | ICD-10-CM

## 2022-04-30 DIAGNOSIS — Z3482 Encounter for supervision of other normal pregnancy, second trimester: Secondary | ICD-10-CM

## 2022-04-30 NOTE — Progress Notes (Signed)
   PRENATAL VISIT NOTE  Subjective:  Lauren Patel is a 26 y.o. G3P2002 at [redacted]w[redacted]d being seen today for ongoing prenatal care.  She is currently monitored for the following issues for this low-risk pregnancy and has Hx gestational diabetes; Supervision of normal pregnancy; and History of postpartum hemorrhage on their problem list.  Patient reports  continuous leakage of clear fluid and pelvic pressure .  Contractions: Not present. Vag. Bleeding: None.  Movement: Present.   The following portions of the patient's history were reviewed and updated as appropriate: allergies, current medications, past family history, past medical history, past social history, past surgical history and problem list.   Objective:   Vitals:   04/30/22 0832  BP: 107/69  Pulse: 97  Weight: 174 lb (78.9 kg)    Fetal Status: Fetal Heart Rate (bpm): 154 Fundal Height: 26 cm Movement: Present     General:  Alert, oriented and cooperative. Patient is in no acute distress.  Skin: Skin is warm and dry. No rash noted.   Cardiovascular: Normal heart rate noted  Respiratory: Normal respiratory effort, no problems with respiration noted  Abdomen: Soft, gravid, appropriate for gestational age.  Pain/Pressure: Absent      Pelvic: NEFG. Physiologic appearing cervical mucus in vagina vault. Cervix visually closed. Negative pool/nitrazine/ferning.  Assessment and Plan:  Pregnancy: G3P2002 at [redacted]w[redacted]d 1. Encounter for supervision of other normal pregnancy in second trimester Had complaints of continuous leakage of clear fluid - spec exam with negative pool/nitrazine/fern & visually closed cervix - Glucose Tolerance, 2 Hours w/1 Hour - HIV antibody (with reflex) - CBC - RPR - Tdap vaccine greater than or equal to 7yo IM  Preterm labor symptoms and general obstetric precautions including but not limited to vaginal bleeding, contractions, leaking of fluid and fetal movement were reviewed in detail with the  patient. Please refer to After Visit Summary for other counseling recommendations.   Return in about 2 weeks (around 05/14/2022) for low risk return OB 30 weeks.  Future Appointments  Date Time Provider Department Center  05/13/2022  1:50 PM Constant, Gigi Gin, MD [facility]   Lennart Pall, MD

## 2022-05-01 ENCOUNTER — Encounter: Payer: Self-pay | Admitting: Obstetrics and Gynecology

## 2022-05-01 LAB — CBC
Hematocrit: 29.7 % — ABNORMAL LOW (ref 34.0–46.6)
Hemoglobin: 9.9 g/dL — ABNORMAL LOW (ref 11.1–15.9)
MCH: 26.8 pg (ref 26.6–33.0)
MCHC: 33.3 g/dL (ref 31.5–35.7)
MCV: 81 fL (ref 79–97)
Platelets: 208 10*3/uL (ref 150–450)
RBC: 3.69 x10E6/uL — ABNORMAL LOW (ref 3.77–5.28)
RDW: 11.6 % — ABNORMAL LOW (ref 11.7–15.4)
WBC: 13.1 10*3/uL — ABNORMAL HIGH (ref 3.4–10.8)

## 2022-05-01 LAB — GLUCOSE TOLERANCE, 2 HOURS W/ 1HR
Glucose, 1 hour: 141 mg/dL (ref 70–179)
Glucose, 2 hour: 109 mg/dL (ref 70–152)
Glucose, Fasting: 81 mg/dL (ref 70–91)

## 2022-05-01 LAB — RPR: RPR Ser Ql: NONREACTIVE

## 2022-05-01 LAB — HIV ANTIBODY (ROUTINE TESTING W REFLEX): HIV Screen 4th Generation wRfx: NONREACTIVE

## 2022-05-04 NOTE — L&D Delivery Note (Addendum)
LABOR COURSE Lauren Patel is a 27 y.o. female G3P2002 presented at 40w3din spontaneous labor. She arrived in labor and delivery and received an epidural. She was AROM'd to clear.   Delivery Note Called to room and patient was complete and pushing. Head delivered LOP and restituted to LOT. No nuchal cord present. Shoulder and body delivered in usual fashion. She was pushing with excellent effort  and she had a SVD at 1444 a viable female.  Infant with spontaneous cry, placed on mother's abdomen, dried and stimulated. Cord clamped x 2 after 1-minute delay, and cut by patient's mom. Cord blood drawn. Placenta delivered spontaneously with gentle cord traction. Appears intact. Fundus firm with massage and Pitocin. Labia, perineum, vagina, and cervix inspected. She was found to have a second degree perineal laceration that is repaired under epidural anesthesia with 3-0 vicryl in the usual fashion.    APGAR: 9, 9; weight 4250g .   Cord: 3VC   Anesthesia:  epidural Episiotomy: None Lacerations: second degree perineal Suture Repair: 3.0 vicryl Est. Blood Loss (mL): 76cc  Mom to postpartum.  Baby to Couplet care / Skin to Skin.  Delivery was attended by DMaryagnes Amos CNM. Lauren Patel "KDarlyne Russian M.D. PGY-2 Family Medicine Visiting Resident Faculty Practice 07/16/2022 3:25 PM    Attestation of CNM Supervision of Resident: Evaluation and management procedures were performed by the [facility] under my supervision. I was immediately present, gloved, and available for direct supervision, assistance and direction throughout this encounter. I also confirm that I have verified the information documented in the resident's note, and that I have also personally reperformed the pertinent components of the physical exam and all of the medical decision making activities.  I have also made any necessary editorial changes.  DRenee Harder CNM 07/16/2022 3:47 PM

## 2022-05-13 ENCOUNTER — Ambulatory Visit (INDEPENDENT_AMBULATORY_CARE_PROVIDER_SITE_OTHER): Payer: Medicaid Other | Admitting: Obstetrics and Gynecology

## 2022-05-13 ENCOUNTER — Encounter: Payer: Self-pay | Admitting: Obstetrics and Gynecology

## 2022-05-13 VITALS — BP 106/68 | HR 83 | Wt 179.0 lb

## 2022-05-13 DIAGNOSIS — Z3A30 30 weeks gestation of pregnancy: Secondary | ICD-10-CM | POA: Diagnosis not present

## 2022-05-13 DIAGNOSIS — Z3483 Encounter for supervision of other normal pregnancy, third trimester: Secondary | ICD-10-CM | POA: Diagnosis not present

## 2022-05-13 DIAGNOSIS — D508 Other iron deficiency anemias: Secondary | ICD-10-CM

## 2022-05-13 NOTE — Progress Notes (Signed)
   PRENATAL VISIT NOTE  Subjective:  Lauren Patel is a 27 y.o. G3P2002 at [redacted]w[redacted]d being seen today for ongoing prenatal care.  She is currently monitored for the following issues for this low-risk pregnancy and has Hx gestational diabetes; Anemia; Supervision of normal pregnancy; and History of postpartum hemorrhage on their problem list.  Patient reports no complaints.  Contractions: Not present. Vag. Bleeding: None.  Movement: Present. Denies leaking of fluid.   The following portions of the patient's history were reviewed and updated as appropriate: allergies, current medications, past family history, past medical history, past social history, past surgical history and problem list.   Objective:   Vitals:   05/13/22 1355  BP: 106/68  Pulse: 83  Weight: 179 lb (81.2 kg)    Fetal Status: Fetal Heart Rate (bpm): 136 Fundal Height: 30 cm Movement: Present     General:  Alert, oriented and cooperative. Patient is in no acute distress.  Skin: Skin is warm and dry. No rash noted.   Cardiovascular: Normal heart rate noted  Respiratory: Normal respiratory effort, no problems with respiration noted  Abdomen: Soft, gravid, appropriate for gestational age.  Pain/Pressure: Present     Pelvic: Cervical exam deferred        Extremities: Normal range of motion.  Edema: None  Mental Status: Normal mood and affect. Normal behavior. Normal judgment and thought content.   Assessment and Plan:  Pregnancy: G3P2002 at [redacted]w[redacted]d 1. Encounter for supervision of other normal pregnancy in third trimester Patient is doing well without complaints  2. Other iron deficiency anemia Continue iron supplement CBC next visit  Preterm labor symptoms and general obstetric precautions including but not limited to vaginal bleeding, contractions, leaking of fluid and fetal movement were reviewed in detail with the patient. Please refer to After Visit Summary for other counseling recommendations.   Return in  about 2 weeks (around 05/27/2022) for in person, ROB, Low risk.  Future Appointments  Date Time Provider [place]  05/27/2022  1:10 PM Inez Catalina, MD [facility]  06/10/2022  1:30 PM Radene Gunning, MD [facility]  06/24/2022  1:10 PM Renee Harder, CNM [facility]  07/01/2022  1:50 PM Inez Catalina, MD [facility]    Mora Bellman, MD

## 2022-05-27 ENCOUNTER — Ambulatory Visit (INDEPENDENT_AMBULATORY_CARE_PROVIDER_SITE_OTHER): Payer: 59 | Admitting: Obstetrics and Gynecology

## 2022-05-27 VITALS — BP 104/67 | HR 81 | Wt 181.0 lb

## 2022-05-27 DIAGNOSIS — Z3A32 32 weeks gestation of pregnancy: Secondary | ICD-10-CM

## 2022-05-27 DIAGNOSIS — Z8759 Personal history of other complications of pregnancy, childbirth and the puerperium: Secondary | ICD-10-CM

## 2022-05-27 DIAGNOSIS — D508 Other iron deficiency anemias: Secondary | ICD-10-CM

## 2022-05-27 DIAGNOSIS — Z3483 Encounter for supervision of other normal pregnancy, third trimester: Secondary | ICD-10-CM

## 2022-05-27 NOTE — Progress Notes (Signed)
   PRENATAL VISIT NOTE  Subjective:  Lauren Patel is a 27 y.o. G3P2002 at [redacted]w[redacted]d being seen today for ongoing prenatal care.  She is currently monitored for the following issues for this low-risk pregnancy and has Hx gestational diabetes; Anemia; Supervision of normal pregnancy; and History of postpartum hemorrhage on their problem list.  Patient reports  leg pain/cramping (anterior thigh) and episode of night sweats/nausea/malaise that has resolved .  Contractions: Irritability. Vag. Bleeding: None.  Movement: Present. Denies leaking of fluid.   The following portions of the patient's history were reviewed and updated as appropriate: allergies, current medications, past family history, past medical history, past social history, past surgical history and problem list.   Objective:   Vitals:   05/27/22 1311  BP: 104/67  Pulse: 81  Weight: 181 lb (82.1 kg)   Fetal Status: Fetal Heart Rate (bpm): 148 Fundal Height: 31 cm Movement: Present     General:  Alert, oriented and cooperative. Patient is in no acute distress.  Skin: Skin is warm and dry. No rash noted.   Cardiovascular: Normal heart rate noted  Respiratory: Normal respiratory effort, no problems with respiration noted  Abdomen: Soft, gravid, appropriate for gestational age.  Pain/Pressure: Present      Assessment and Plan:  Pregnancy: G3P2002 at [redacted]w[redacted]d 1. Encounter for supervision of other normal pregnancy in third trimester 2. [redacted] weeks gestation of pregnancy Doing well. Discussed OTC medications for pain (tylenol, icy hot).  3. Other iron deficiency anemia 4. History of postpartum hemorrhage On po iron QoD - discussed that taking w/ vit C can help absorption  Repeat CBC today. If Hgb 9 range or less, plan for IV iron for optimization of Hgb prior to delivery (esp given hx PPH) - CBC  Return in about 2 weeks (around 06/10/2022) for return OB at 34 weeks.  Future Appointments  Date Time Provider [place]   06/10/2022  1:30 PM Radene Gunning, MD [facility]  06/24/2022  1:10 PM Renee Harder, CNM [facility]  07/01/2022  1:50 PM Inez Catalina, MD [facility]   Inez Catalina, MD

## 2022-05-28 LAB — CBC
Hematocrit: 28.7 % — ABNORMAL LOW (ref 34.0–46.6)
Hemoglobin: 9.6 g/dL — ABNORMAL LOW (ref 11.1–15.9)
MCH: 26.8 pg (ref 26.6–33.0)
MCHC: 33.4 g/dL (ref 31.5–35.7)
MCV: 80 fL (ref 79–97)
Platelets: 186 10*3/uL (ref 150–450)
RBC: 3.58 x10E6/uL — ABNORMAL LOW (ref 3.77–5.28)
RDW: 12.9 % (ref 11.7–15.4)
WBC: 12.2 10*3/uL — ABNORMAL HIGH (ref 3.4–10.8)

## 2022-06-01 ENCOUNTER — Encounter: Payer: Self-pay | Admitting: Obstetrics and Gynecology

## 2022-06-01 NOTE — Addendum Note (Signed)
Addended by: Gale Journey on: 06/01/2022 04:19 PM   Modules accepted: Orders

## 2022-06-04 ENCOUNTER — Other Ambulatory Visit: Payer: Self-pay | Admitting: Obstetrics and Gynecology

## 2022-06-04 DIAGNOSIS — Z3483 Encounter for supervision of other normal pregnancy, third trimester: Secondary | ICD-10-CM

## 2022-06-04 DIAGNOSIS — Z8759 Personal history of other complications of pregnancy, childbirth and the puerperium: Secondary | ICD-10-CM

## 2022-06-04 DIAGNOSIS — D508 Other iron deficiency anemias: Secondary | ICD-10-CM

## 2022-06-05 ENCOUNTER — Other Ambulatory Visit: Payer: Self-pay | Admitting: Obstetrics and Gynecology

## 2022-06-05 DIAGNOSIS — D508 Other iron deficiency anemias: Secondary | ICD-10-CM

## 2022-06-05 NOTE — Addendum Note (Signed)
Addended by: Gale Journey on: 06/05/2022 09:47 AM   Modules accepted: Orders

## 2022-06-05 NOTE — Progress Notes (Signed)
IV iron orders entered 

## 2022-06-09 NOTE — Progress Notes (Unsigned)
   PRENATAL VISIT NOTE  Subjective:  Lauren Patel is a 27 y.o. G3P2002 at [redacted]w[redacted]d being seen today for ongoing prenatal care.  She is currently monitored for the following issues for this high-risk pregnancy and has Hx gestational diabetes; Anemia; Supervision of normal pregnancy; and History of postpartum hemorrhage on their problem list.  Patient reports {sx:14538}.   .  .   . Denies leaking of fluid.   The following portions of the patient's history were reviewed and updated as appropriate: allergies, current medications, past family history, past medical history, past social history, past surgical history and problem list.   Objective:  There were no vitals filed for this visit.  Fetal Status:           General:  Alert, oriented and cooperative. Patient is in no acute distress.  Skin: Skin is warm and dry. No rash noted.   Cardiovascular: Normal heart rate noted  Respiratory: Normal respiratory effort, no problems with respiration noted  Abdomen: Soft, gravid, appropriate for gestational age.        Pelvic: Cervical exam deferred        Extremities: Normal range of motion.     Mental Status: Normal mood and affect. Normal behavior. Normal judgment and thought content.   Assessment and Plan:  Pregnancy: G3P2002 at [redacted]w[redacted]d 1. Encounter for supervision of other normal pregnancy in third trimester Cultures next time  2. Other iron deficiency anemia First IV iron is on 2/12.   3. History of postpartum hemorrhage Optimize hgb as much as possible prior to delivery. Have medications prepared for delivery. Pitocin following delivery.   Preterm labor symptoms and general obstetric precautions including but not limited to vaginal bleeding, contractions, leaking of fluid and fetal movement were reviewed in detail with the patient. Please refer to After Visit Summary for other counseling recommendations.   No follow-ups on file.  Future Appointments  Date Time Provider  [place]  06/10/2022  1:30 PM Radene Gunning, MD [facility]  06/15/2022  8:00 AM [facility] None  06/24/2022  1:10 PM Renee Harder, CNM [facility]  07/01/2022  1:50 PM Inez Catalina, MD [facility]  07/09/2022  1:10 PM Radene Gunning, MD [facility]  07/15/2022  1:30 PM Radene Gunning, MD [facility]    Radene Gunning, MD

## 2022-06-10 ENCOUNTER — Ambulatory Visit: Payer: 59 | Admitting: Obstetrics and Gynecology

## 2022-06-10 ENCOUNTER — Encounter: Payer: Self-pay | Admitting: Obstetrics and Gynecology

## 2022-06-10 VITALS — BP 105/65 | HR 78 | Wt 185.0 lb

## 2022-06-10 DIAGNOSIS — Z8759 Personal history of other complications of pregnancy, childbirth and the puerperium: Secondary | ICD-10-CM

## 2022-06-10 DIAGNOSIS — D508 Other iron deficiency anemias: Secondary | ICD-10-CM

## 2022-06-10 DIAGNOSIS — Z3A34 34 weeks gestation of pregnancy: Secondary | ICD-10-CM

## 2022-06-10 DIAGNOSIS — Z3483 Encounter for supervision of other normal pregnancy, third trimester: Secondary | ICD-10-CM

## 2022-06-15 ENCOUNTER — Encounter (HOSPITAL_COMMUNITY)
Admission: RE | Admit: 2022-06-15 | Discharge: 2022-06-15 | Disposition: A | Discharge status: Home / Self Care | Payer: 59 | Source: Ambulatory Visit | Attending: Obstetrics and Gynecology | Admitting: Obstetrics and Gynecology

## 2022-06-15 DIAGNOSIS — Z3A37 37 weeks gestation of pregnancy: Secondary | ICD-10-CM | POA: Insufficient documentation

## 2022-06-15 DIAGNOSIS — O99013 Anemia complicating pregnancy, third trimester: Secondary | ICD-10-CM | POA: Diagnosis present

## 2022-06-15 DIAGNOSIS — D509 Iron deficiency anemia, unspecified: Secondary | ICD-10-CM | POA: Insufficient documentation

## 2022-06-15 MED ORDER — SODIUM CHLORIDE 0.9% FLUSH: 10.0000 mL | INTRAVENOUS | Status: DC | PRN

## 2022-06-15 MED ORDER — FAMOTIDINE IN NACL 20-0.9 MG/50ML-% IV SOLN: 20.0000 mg | Freq: Once | INTRAVENOUS | Status: DC | PRN

## 2022-06-15 MED ORDER — ACETAMINOPHEN 325 MG PO TABS: 650.0000 mg | ORAL_TABLET | Freq: Once | ORAL | Status: AC

## 2022-06-15 MED ORDER — DIPHENHYDRAMINE HCL 50 MG/ML IJ SOLN: 50.0000 mg | Freq: Once | INTRAMUSCULAR | Status: DC | PRN

## 2022-06-15 MED ORDER — ACETAMINOPHEN 325 MG PO TABS
ORAL_TABLET | ORAL | Status: AC
  Administered 2022-06-15: 650 mg via ORAL
  Filled 2022-06-15: qty 2

## 2022-06-15 MED ORDER — EPINEPHRINE 0.3 MG/0.3ML IJ SOAJ: 0.3000 mg | Freq: Once | INTRAMUSCULAR | Status: DC | PRN

## 2022-06-15 MED ORDER — ALTEPLASE 2 MG IJ SOLR: 2.0000 mg | Freq: Once | INTRAMUSCULAR | Status: DC | PRN

## 2022-06-15 MED ORDER — HEPARIN SOD (PORK) LOCK FLUSH 100 UNIT/ML IV SOLN: 500.0000 [IU] | Freq: Once | INTRAVENOUS | Status: DC

## 2022-06-15 MED ORDER — METHYLPREDNISOLONE SODIUM SUCC 125 MG IJ SOLR: 125.0000 mg | Freq: Once | INTRAMUSCULAR | Status: DC | PRN

## 2022-06-15 MED ORDER — SODIUM CHLORIDE 0.9 % IV SOLN
500.0000 mg | Freq: Once | INTRAVENOUS | Status: AC
  Administered 2022-06-15: 500 mg via INTRAVENOUS
  Filled 2022-06-15: qty 500

## 2022-06-15 MED ORDER — DIPHENHYDRAMINE HCL 25 MG PO CAPS: 25.0000 mg | ORAL_CAPSULE | Freq: Once | ORAL | Status: AC

## 2022-06-15 MED ORDER — ALBUTEROL SULFATE HFA 108 (90 BASE) MCG/ACT IN AERS: 2.0000 | INHALATION_SPRAY | Freq: Once | RESPIRATORY_TRACT | Status: DC | PRN

## 2022-06-15 MED ORDER — DIPHENHYDRAMINE HCL 25 MG PO CAPS
ORAL_CAPSULE | ORAL | Status: AC
  Administered 2022-06-15: 25 mg via ORAL
  Filled 2022-06-15: qty 1

## 2022-06-15 MED ORDER — SODIUM CHLORIDE 0.9 % IV SOLN: Freq: Once | INTRAVENOUS | Status: DC | PRN

## 2022-06-17 ENCOUNTER — Encounter (HOSPITAL_COMMUNITY): Payer: 59

## 2022-06-19 ENCOUNTER — Encounter (HOSPITAL_COMMUNITY): Payer: 59

## 2022-06-24 ENCOUNTER — Ambulatory Visit (INDEPENDENT_AMBULATORY_CARE_PROVIDER_SITE_OTHER): Payer: 59

## 2022-06-24 ENCOUNTER — Other Ambulatory Visit: Payer: Self-pay | Admitting: *Deleted

## 2022-06-24 ENCOUNTER — Other Ambulatory Visit (HOSPITAL_COMMUNITY)
Admission: RE | Admit: 2022-06-24 | Discharge: 2022-06-24 | Disposition: A | Discharge status: Home / Self Care | Payer: 59 | Source: Ambulatory Visit

## 2022-06-24 ENCOUNTER — Ambulatory Visit: Payer: 59

## 2022-06-24 VITALS — BP 109/69 | HR 89 | Wt 192.0 lb

## 2022-06-24 DIAGNOSIS — Z3A36 36 weeks gestation of pregnancy: Secondary | ICD-10-CM

## 2022-06-24 DIAGNOSIS — M79662 Pain in left lower leg: Secondary | ICD-10-CM | POA: Diagnosis not present

## 2022-06-24 DIAGNOSIS — Z3483 Encounter for supervision of other normal pregnancy, third trimester: Secondary | ICD-10-CM | POA: Diagnosis not present

## 2022-06-24 DIAGNOSIS — D508 Other iron deficiency anemias: Secondary | ICD-10-CM

## 2022-06-24 DIAGNOSIS — Z8759 Personal history of other complications of pregnancy, childbirth and the puerperium: Secondary | ICD-10-CM

## 2022-06-24 NOTE — Progress Notes (Signed)
   PRENATAL VISIT NOTE  Subjective:  Lauren Patel is a 27 y.o. G3P2002 at 81w2dbeing seen today for ongoing prenatal care.  She is currently monitored for the following issues for this low-risk pregnancy and has Hx gestational diabetes; Anemia; Supervision of normal pregnancy; and History of postpartum hemorrhage on their problem list.  Patient reports pain in left lower leg x1-2 weeks. Pain starts behind the knee and radiates into calf. She reports it feels like a "swooshing sensation" in her calf. Nothing aggravates or improves. No redness, warmth, or significant swelling. No chest pain or shortness of breath .  Contractions: Not present. Vag. Bleeding: None.  Movement: Present. Denies leaking of fluid.   The following portions of the patient's history were reviewed and updated as appropriate: allergies, current medications, past family history, past medical history, past social history, past surgical history and problem list.   Objective:   Vitals:   06/24/22 1320  BP: 109/69  Pulse: 89  Weight: 192 lb (87.1 kg)    Fetal Status: Fetal Heart Rate (bpm): 136 Fundal Height: 36 cm Movement: Present  Presentation: Vertex  General:  Alert, oriented and cooperative. Patient is in no acute distress.  Skin: Skin is warm and dry. No rash noted.   Cardiovascular: Normal heart rate noted  Respiratory: Normal respiratory effort, no problems with respiration noted  Abdomen: Soft, gravid, appropriate for gestational age.  Pain/Pressure: Absent     Pelvic: Cervical exam performed in the presence of a chaperone Dilation: 1 Effacement (%): 80 Station: -3  Extremities: Normal range of motion.  Edema: Trace  Mental Status: Normal mood and affect. Normal behavior. Normal judgment and thought content.   Assessment and Plan:  Pregnancy: G3P2002 at 362w2d. Encounter for supervision of other normal pregnancy in third trimester - Routine OB - Cultures today  - Cervicovaginal ancillary only(  [place]) - Culture, beta strep (group b only)  2. [redacted] weeks gestation of pregnancy - FH appropriate - Endorses active fetal movement  3. Other iron deficiency anemia - S/p IV iron - Repeat CBC today  - CBC  4. History of postpartum hemorrhage   5. Pain in left lower leg - Pain in left lower leg x1-2 weeks - Will send for stat doppler    - [place]ous Img Lower Unilateral Left (DVT); Future  Preterm labor symptoms and general obstetric precautions including but not limited to vaginal bleeding, contractions, leaking of fluid and fetal movement were reviewed in detail with the patient. Please refer to After Visit Summary for other counseling recommendations.   Return in about 1 week (around 07/01/2022).  Future Appointments  Date Time Provider [place]2/28/2024  1:50 PM FoInez CatalinaMD [facility]/11/2022  1:10 PM DuRadene GunningMD [facility]/13/2024  1:30 PM DuRadene GunningMD CWHerkimerCNM

## 2022-06-24 NOTE — Progress Notes (Signed)
Pt has noticed an increase in swelling in her legs and has left leg pain

## 2022-06-25 LAB — CBC
Hematocrit: 31.9 % — ABNORMAL LOW (ref 34.0–46.6)
Hemoglobin: 10.5 g/dL — ABNORMAL LOW (ref 11.1–15.9)
MCH: 26.9 pg (ref 26.6–33.0)
MCHC: 32.9 g/dL (ref 31.5–35.7)
MCV: 82 fL (ref 79–97)
Platelets: 189 10*3/uL (ref 150–450)
RBC: 3.9 x10E6/uL (ref 3.77–5.28)
RDW: 14.7 % (ref 11.7–15.4)
WBC: 11.2 10*3/uL — ABNORMAL HIGH (ref 3.4–10.8)

## 2022-06-25 LAB — CERVICOVAGINAL ANCILLARY ONLY
Chlamydia: NEGATIVE
Comment: NEGATIVE
Comment: NORMAL
Neisseria Gonorrhea: NEGATIVE

## 2022-06-27 LAB — CULTURE, BETA STREP (GROUP B ONLY)
MICRO NUMBER:: 14595348
SPECIMEN QUALITY:: ADEQUATE

## 2022-07-01 ENCOUNTER — Ambulatory Visit: Payer: 59 | Admitting: Obstetrics and Gynecology

## 2022-07-01 VITALS — BP 109/72 | HR 74 | Wt 193.0 lb

## 2022-07-01 DIAGNOSIS — Z3A37 37 weeks gestation of pregnancy: Secondary | ICD-10-CM

## 2022-07-01 DIAGNOSIS — Z3483 Encounter for supervision of other normal pregnancy, third trimester: Secondary | ICD-10-CM | POA: Diagnosis not present

## 2022-07-01 DIAGNOSIS — D508 Other iron deficiency anemias: Secondary | ICD-10-CM

## 2022-07-01 DIAGNOSIS — Z8759 Personal history of other complications of pregnancy, childbirth and the puerperium: Secondary | ICD-10-CM

## 2022-07-01 NOTE — Progress Notes (Signed)
   PRENATAL VISIT NOTE  Subjective:  Lauren Patel is a 27 y.o. G3P2002 at 73w2dbeing seen today for ongoing prenatal care.  She is currently monitored for the following issues for this low-risk pregnancy and has Hx gestational diabetes; Anemia; Supervision of normal pregnancy; and History of postpartum hemorrhage on their problem list.  Patient reports  pressure .  Contractions: Not present. Vag. Bleeding: None.  Movement: Present. Denies leaking of fluid.   The following portions of the patient's history were reviewed and updated as appropriate: allergies, current medications, past family history, past medical history, past social history, past surgical history and problem list.   Objective:   Vitals:   07/01/22 1402  BP: 109/72  Pulse: 74  Weight: 193 lb (87.5 kg)    Fetal Status: Fetal Heart Rate (bpm): 146 Fundal Height: 36 cm Movement: Present  Presentation: Vertex  General:  Alert, oriented and cooperative. Patient is in no acute distress.  Skin: Skin is warm and dry. No rash noted.   Cardiovascular: Normal heart rate noted  Respiratory: Normal respiratory effort, no problems with respiration noted  Abdomen: Soft, gravid, appropriate for gestational age.  Pain/Pressure: Present      Assessment and Plan:  Pregnancy: G3P2002 at 388w2d. Encounter for supervision of other normal pregnancy in third trimester 2. [redacted] weeks gestation of pregnancy Pubic symphysis pain Reviewed term labor precautions  3. Other iron deficiency anemia S/p IV iron w/ improvement to Hgb 10.5 Continue po ion  4. History of postpartum hemorrhage Continue to optimize hgb as above  Term labor symptoms and general obstetric precautions including but not limited to vaginal bleeding, contractions, leaking of fluid and fetal movement were reviewed in detail with the patient.  Please refer to After Visit Summary for other counseling recommendations.   Return in about 1 week (around 07/08/2022)  for return OB at 38 weeks.  Future Appointments  Date Time Provider [place]ket[place]3/11/2022  1:10 PM DuRadene GunningMD [facility]3/13/2024  1:30 PM DuRadene GunningMD [facility] KyInez CatalinaMD

## 2022-07-05 NOTE — Progress Notes (Unsigned)
   PRENATAL VISIT NOTE  Subjective:  Lauren Patel is a 27 y.o. G3P2002 at 35w3dbeing seen today for ongoing prenatal care.  She is currently monitored for the following issues for this low-risk pregnancy and has Hx gestational diabetes; Anemia; Supervision of normal pregnancy; and History of postpartum hemorrhage on their problem list.  Patient reports occasional contractions.  Contractions: Irritability. Vag. Bleeding: None.  Movement: Present. Denies leaking of fluid.   The following portions of the patient's history were reviewed and updated as appropriate: allergies, current medications, past family history, past medical history, past social history, past surgical history and problem list.   Objective:   Vitals:   07/09/22 1317  BP: 114/70  Pulse: 81  Weight: 196 lb (88.9 kg)    Fetal Status: Fetal Heart Rate (bpm): 151 Fundal Height: 37 cm Movement: Present     General:  Alert, oriented and cooperative. Patient is in no acute distress.  Skin: Skin is warm and dry. No rash noted.   Cardiovascular: Normal heart rate noted  Respiratory: Normal respiratory effort, no problems with respiration noted  Abdomen: Soft, gravid, appropriate for gestational age.  Pain/Pressure: Present     Pelvic: Cervical exam deferred        Extremities: Normal range of motion.  Edema: Trace  Mental Status: Normal mood and affect. Normal behavior. Normal judgment and thought content.   Assessment and Plan:  Pregnancy: G3P2002 at 328w3d. Other iron deficiency anemia Improved on PO and IV iron from 9.6 to 10.5.   2. Encounter for supervision of other normal pregnancy in third trimester GBS neg Discussed option for membrane sweep at 39w  3. History of postpartum hemorrhage Improved HgB as noted above.   Term labor symptoms and general obstetric precautions including but not limited to vaginal bleeding, contractions, leaking of fluid and fetal movement were reviewed in detail with the  patient. Please refer to After Visit Summary for other counseling recommendations.   Return in about 1 week (around 07/16/2022) for LROB VISIT, MD or APP.  Future Appointments  Date Time Provider De[place]3/13/2024  1:30 PM DuRadene GunningMD [facility]  PaRadene GunningMD

## 2022-07-09 ENCOUNTER — Encounter: Payer: Self-pay | Admitting: Obstetrics and Gynecology

## 2022-07-09 ENCOUNTER — Ambulatory Visit (INDEPENDENT_AMBULATORY_CARE_PROVIDER_SITE_OTHER): Payer: 59 | Admitting: Obstetrics and Gynecology

## 2022-07-09 VITALS — BP 114/70 | HR 81 | Wt 196.0 lb

## 2022-07-09 DIAGNOSIS — Z3483 Encounter for supervision of other normal pregnancy, third trimester: Secondary | ICD-10-CM

## 2022-07-09 DIAGNOSIS — D508 Other iron deficiency anemias: Secondary | ICD-10-CM

## 2022-07-09 DIAGNOSIS — Z8759 Personal history of other complications of pregnancy, childbirth and the puerperium: Secondary | ICD-10-CM

## 2022-07-09 DIAGNOSIS — Z3A38 38 weeks gestation of pregnancy: Secondary | ICD-10-CM

## 2022-07-14 NOTE — Progress Notes (Unsigned)
   PRENATAL VISIT NOTE  Subjective:  Lauren Patel is a 27 y.o. G3P2002 at [redacted]w[redacted]d being seen today for ongoing prenatal care.  She is currently monitored for the following issues for this low-risk pregnancy and has Hx gestational diabetes; Anemia; Supervision of normal pregnancy; and History of postpartum hemorrhage on their problem list.  Patient reports {sx:14538}.   .  .   . Denies leaking of fluid.   The following portions of the patient's history were reviewed and updated as appropriate: allergies, current medications, past family history, past medical history, past social history, past surgical history and problem list.   Objective:  There were no vitals filed for this visit.  Fetal Status:           General:  Alert, oriented and cooperative. Patient is in no acute distress.  Skin: Skin is warm and dry. No rash noted.   Cardiovascular: Normal heart rate noted  Respiratory: Normal respiratory effort, no problems with respiration noted  Abdomen: Soft, gravid, appropriate for gestational age.        Pelvic: {Blank single:19197::"Cervical exam performed in the presence of a chaperone","Cervical exam deferred"}        Extremities: Normal range of motion.     Mental Status: Normal mood and affect. Normal behavior. Normal judgment and thought content.   Assessment and Plan:  Pregnancy: G3P2002 at [redacted]w[redacted]d 1. History of postpartum hemorrhage Continue PO iron to optimize hgb before delivery.   2. Encounter for supervision of other normal pregnancy in third trimester ***  3. Other iron deficiency anemia Improving on PO iron  Term labor symptoms and general obstetric precautions including but not limited to vaginal bleeding, contractions, leaking of fluid and fetal movement were reviewed in detail with the patient. Please refer to After Visit Summary for other counseling recommendations.   No follow-ups on file.  Future Appointments  Date Time Provider [place]   07/15/2022  1:30 PM Radene Gunning, MD [facility]    Radene Gunning, MD

## 2022-07-15 ENCOUNTER — Ambulatory Visit (INDEPENDENT_AMBULATORY_CARE_PROVIDER_SITE_OTHER): Payer: Medicaid Other | Admitting: Obstetrics and Gynecology

## 2022-07-15 ENCOUNTER — Encounter: Payer: Self-pay | Admitting: Obstetrics and Gynecology

## 2022-07-15 VITALS — BP 121/83 | HR 90 | Wt 196.0 lb

## 2022-07-15 DIAGNOSIS — Z3A39 39 weeks gestation of pregnancy: Secondary | ICD-10-CM

## 2022-07-15 DIAGNOSIS — Z3483 Encounter for supervision of other normal pregnancy, third trimester: Secondary | ICD-10-CM

## 2022-07-15 DIAGNOSIS — O99013 Anemia complicating pregnancy, third trimester: Secondary | ICD-10-CM

## 2022-07-15 DIAGNOSIS — Z8759 Personal history of other complications of pregnancy, childbirth and the puerperium: Secondary | ICD-10-CM

## 2022-07-15 DIAGNOSIS — D508 Other iron deficiency anemias: Secondary | ICD-10-CM

## 2022-07-16 ENCOUNTER — Encounter (HOSPITAL_COMMUNITY): Payer: Self-pay | Admitting: Obstetrics and Gynecology

## 2022-07-16 ENCOUNTER — Inpatient Hospital Stay (HOSPITAL_COMMUNITY)
Admission: AD | Admit: 2022-07-16 | Discharge: 2022-07-18 | DRG: 807 | Disposition: A | Discharge status: Home / Self Care | Payer: BC Managed Care – PPO | Attending: Family Medicine | Admitting: Family Medicine

## 2022-07-16 ENCOUNTER — Encounter: Payer: Self-pay | Admitting: Obstetrics and Gynecology

## 2022-07-16 ENCOUNTER — Inpatient Hospital Stay (HOSPITAL_COMMUNITY): Payer: BC Managed Care – PPO | Admitting: Anesthesiology

## 2022-07-16 DIAGNOSIS — Z3A39 39 weeks gestation of pregnancy: Secondary | ICD-10-CM

## 2022-07-16 DIAGNOSIS — D649 Anemia, unspecified: Secondary | ICD-10-CM | POA: Diagnosis present

## 2022-07-16 DIAGNOSIS — O26893 Other specified pregnancy related conditions, third trimester: Secondary | ICD-10-CM | POA: Diagnosis present

## 2022-07-16 DIAGNOSIS — D509 Iron deficiency anemia, unspecified: Secondary | ICD-10-CM | POA: Diagnosis present

## 2022-07-16 DIAGNOSIS — O9902 Anemia complicating childbirth: Principal | ICD-10-CM | POA: Diagnosis present

## 2022-07-16 DIAGNOSIS — Z3483 Encounter for supervision of other normal pregnancy, third trimester: Principal | ICD-10-CM

## 2022-07-16 DIAGNOSIS — Z7982 Long term (current) use of aspirin: Secondary | ICD-10-CM

## 2022-07-16 DIAGNOSIS — Z8759 Personal history of other complications of pregnancy, childbirth and the puerperium: Secondary | ICD-10-CM

## 2022-07-16 LAB — TYPE AND SCREEN
ABO/RH(D): O POS
Antibody Screen: NEGATIVE

## 2022-07-16 LAB — CBC
HCT: 38.1 % (ref 36.0–46.0)
Hemoglobin: 11.9 g/dL — ABNORMAL LOW (ref 12.0–15.0)
MCH: 26.7 pg (ref 26.0–34.0)
MCHC: 31.2 g/dL (ref 30.0–36.0)
MCV: 85.6 fL (ref 80.0–100.0)
Platelets: 160 10*3/uL (ref 150–400)
RBC: 4.45 MIL/uL (ref 3.87–5.11)
RDW: 15.7 % — ABNORMAL HIGH (ref 11.5–15.5)
WBC: 13.4 10*3/uL — ABNORMAL HIGH (ref 4.0–10.5)
nRBC: 0 % (ref 0.0–0.2)

## 2022-07-16 LAB — OB RESULTS CONSOLE GBS: GBS: NEGATIVE

## 2022-07-16 LAB — RPR: RPR Ser Ql: NONREACTIVE

## 2022-07-16 MED ORDER — DIPHENHYDRAMINE HCL 25 MG PO CAPS: 25.0000 mg | ORAL_CAPSULE | Freq: Four times a day (QID) | ORAL | Status: DC | PRN

## 2022-07-16 MED ORDER — SENNOSIDES-DOCUSATE SODIUM 8.6-50 MG PO TABS
2.0000 | ORAL_TABLET | Freq: Every day | ORAL | Status: DC
  Administered 2022-07-17 – 2022-07-18 (×2): 2 via ORAL
  Filled 2022-07-16 (×2): qty 2

## 2022-07-16 MED ORDER — PHENYLEPHRINE 80 MCG/ML (10ML) SYRINGE FOR IV PUSH (FOR BLOOD PRESSURE SUPPORT): 80.0000 ug | PREFILLED_SYRINGE | INTRAVENOUS | Status: DC | PRN

## 2022-07-16 MED ORDER — DIBUCAINE (PERIANAL) 1 % EX OINT: 1.0000 | TOPICAL_OINTMENT | CUTANEOUS | Status: DC | PRN

## 2022-07-16 MED ORDER — SOD CITRATE-CITRIC ACID 500-334 MG/5ML PO SOLN: 30.0000 mL | ORAL | Status: DC | PRN

## 2022-07-16 MED ORDER — EPHEDRINE 5 MG/ML INJ
10.0000 mg | INTRAVENOUS | Status: DC | PRN
  Filled 2022-07-16: qty 5

## 2022-07-16 MED ORDER — ONDANSETRON HCL 4 MG PO TABS: 4.0000 mg | ORAL_TABLET | ORAL | Status: DC | PRN

## 2022-07-16 MED ORDER — LACTATED RINGERS IV SOLN: 500.0000 mL | Freq: Once | INTRAVENOUS | Status: DC

## 2022-07-16 MED ORDER — WITCH HAZEL-GLYCERIN EX PADS: 1.0000 | MEDICATED_PAD | CUTANEOUS | Status: DC | PRN

## 2022-07-16 MED ORDER — IBUPROFEN 600 MG PO TABS
600.0000 mg | ORAL_TABLET | Freq: Four times a day (QID) | ORAL | Status: DC
  Administered 2022-07-16 – 2022-07-18 (×8): 600 mg via ORAL
  Filled 2022-07-16 (×8): qty 1

## 2022-07-16 MED ORDER — DIPHENHYDRAMINE HCL 50 MG/ML IJ SOLN: 12.5000 mg | INTRAMUSCULAR | Status: DC | PRN

## 2022-07-16 MED ORDER — LIDOCAINE-EPINEPHRINE (PF) 1.5 %-1:200000 IJ SOLN
INTRAMUSCULAR | Status: DC | PRN
  Administered 2022-07-16: 5 mL via EPIDURAL

## 2022-07-16 MED ORDER — OXYTOCIN-SODIUM CHLORIDE 30-0.9 UT/500ML-% IV SOLN
2.5000 [IU]/h | INTRAVENOUS | Status: DC
  Filled 2022-07-16: qty 500

## 2022-07-16 MED ORDER — OXYCODONE-ACETAMINOPHEN 5-325 MG PO TABS: 2.0000 | ORAL_TABLET | ORAL | Status: DC | PRN

## 2022-07-16 MED ORDER — BENZOCAINE-MENTHOL 20-0.5 % EX AERO
1.0000 | INHALATION_SPRAY | CUTANEOUS | Status: DC | PRN
  Administered 2022-07-16: 1 via TOPICAL
  Filled 2022-07-16: qty 56

## 2022-07-16 MED ORDER — ACETAMINOPHEN 325 MG PO TABS
650.0000 mg | ORAL_TABLET | ORAL | Status: DC | PRN
  Administered 2022-07-17 – 2022-07-18 (×2): 650 mg via ORAL
  Filled 2022-07-16 (×2): qty 2

## 2022-07-16 MED ORDER — LIDOCAINE HCL (PF) 1 % IJ SOLN: 30.0000 mL | INTRAMUSCULAR | Status: DC | PRN

## 2022-07-16 MED ORDER — SIMETHICONE 80 MG PO CHEW: 80.0000 mg | CHEWABLE_TABLET | ORAL | Status: DC | PRN

## 2022-07-16 MED ORDER — ACETAMINOPHEN 325 MG PO TABS: 650.0000 mg | ORAL_TABLET | ORAL | Status: DC | PRN

## 2022-07-16 MED ORDER — COCONUT OIL OIL: 1.0000 | TOPICAL_OIL | Status: DC | PRN

## 2022-07-16 MED ORDER — OXYCODONE-ACETAMINOPHEN 5-325 MG PO TABS: 1.0000 | ORAL_TABLET | ORAL | Status: DC | PRN

## 2022-07-16 MED ORDER — LACTATED RINGERS IV SOLN: INTRAVENOUS | Status: DC

## 2022-07-16 MED ORDER — FERROUS SULFATE 325 (65 FE) MG PO TABS
325.0000 mg | ORAL_TABLET | Freq: Every day | ORAL | Status: DC
  Administered 2022-07-17 – 2022-07-18 (×2): 325 mg via ORAL
  Filled 2022-07-16 (×2): qty 1

## 2022-07-16 MED ORDER — LACTATED RINGERS IV SOLN: 500.0000 mL | INTRAVENOUS | Status: DC | PRN

## 2022-07-16 MED ORDER — FENTANYL-BUPIVACAINE-NACL 0.5-0.125-0.9 MG/250ML-% EP SOLN
12.0000 mL/h | EPIDURAL | Status: DC | PRN
  Administered 2022-07-16: 12 mL/h via EPIDURAL
  Filled 2022-07-16: qty 250

## 2022-07-16 MED ORDER — ONDANSETRON HCL 4 MG/2ML IJ SOLN: 4.0000 mg | INTRAMUSCULAR | Status: DC | PRN

## 2022-07-16 MED ORDER — FENTANYL CITRATE (PF) 100 MCG/2ML IJ SOLN
100.0000 ug | INTRAMUSCULAR | Status: DC | PRN
  Administered 2022-07-16: 100 ug via INTRAVENOUS
  Filled 2022-07-16: qty 2

## 2022-07-16 MED ORDER — PRENATAL MULTIVITAMIN CH
1.0000 | ORAL_TABLET | Freq: Every day | ORAL | Status: DC
  Administered 2022-07-17 – 2022-07-18 (×2): 1 via ORAL
  Filled 2022-07-16 (×2): qty 1

## 2022-07-16 MED ORDER — EPHEDRINE 5 MG/ML INJ
10.0000 mg | INTRAVENOUS | Status: DC | PRN
  Administered 2022-07-16: 10 mg via INTRAVENOUS

## 2022-07-16 MED ORDER — OXYTOCIN BOLUS FROM INFUSION
333.0000 mL | Freq: Once | INTRAVENOUS | Status: AC
  Administered 2022-07-16: 333 mL via INTRAVENOUS

## 2022-07-16 MED ORDER — ZOLPIDEM TARTRATE 5 MG PO TABS: 5.0000 mg | ORAL_TABLET | Freq: Every evening | ORAL | Status: DC | PRN

## 2022-07-16 MED ORDER — ONDANSETRON HCL 4 MG/2ML IJ SOLN
4.0000 mg | Freq: Four times a day (QID) | INTRAMUSCULAR | Status: DC | PRN
  Administered 2022-07-16: 4 mg via INTRAVENOUS
  Filled 2022-07-16: qty 2

## 2022-07-16 NOTE — MAU Note (Signed)
Lauren Patel is a 27 y.o. at 57w3dhere in MAU reporting: ctx since 0530 every 578m. Denies LOF, bleeding positive FM Last SVE 3cm on 3/13 in office Pain score: 7/10 Vitals:   07/16/22 0945  BP: 116/74  Pulse: 95  Resp: 17  Temp: 98 F (36.7 C)     FHT:155

## 2022-07-16 NOTE — H&P (Addendum)
OBSTETRIC ADMISSION HISTORY AND PHYSICAL  Lauren Patel is a 27 y.o. female G66P2002 with IUP at 47w3dby LMP presenting for labor. She reports +FMs, No LOF, no VB, no blurry vision, headaches or peripheral edema, and RUQ pain.  She plans on formula feeding. She declines birth control. She received her prenatal care at  [facility] --->  Estimated Date of Delivery: 07/20/22  Sono:    '@[redacted]w[redacted]d'$ , normal anatomy, transverse presentation, posterior placenta '@[redacted]w[redacted]d'$ , EFW 81%, AC 73%  Prenatal History/Complications:  - history of PPH - anemia, on PO iron  Past Medical History: Past Medical History:  Diagnosis Date   Anemia    Gestational diabetes    first child    Past Surgical History: Past Surgical History:  Procedure Laterality Date   MOUTH SURGERY      Obstetrical History: OB History     Gravida  3   Para  2   Term  2   Preterm      AB      Living  2      SAB      IAB      Ectopic      Multiple  0   Live Births  2           Social History Social History   Socioeconomic History   Marital status: Single    Spouse name: Not on file   Number of children: Not on file   Years of education: Not on file   Highest education level: Not on file  Occupational History   Occupation: LRecruitment consultant Tobacco Use   Smoking status: Never    Passive exposure: Yes   Smokeless tobacco: Never  Vaping Use   Vaping Use: Never used  Substance and Sexual Activity   Alcohol use: No   Drug use: No   Sexual activity: Yes    Partners: Male    Birth control/protection: Pill  Other Topics Concern   Not on file  Social History Narrative   Not on file   Social Determinants of Health   Financial Resource Strain: Low Risk  (09/14/2018)   Overall Financial Resource Strain (CARDIA)    Difficulty of Paying Living Expenses: Not hard at all  Food Insecurity: No Food Insecurity (09/14/2018)   Hunger Vital Sign    Worried About Running Out of Food  in the Last Year: Never true    RBeaverin the Last Year: Never true  Transportation Needs: Unknown (09/14/2018)   PRAPARE - THydrologist(Medical): No    Lack of Transportation (Non-Medical): Not on file  Physical Activity: Not on file  Stress: No Stress Concern Present (09/14/2018)   FAnderson   Feeling of Stress : Only a little  Social Connections: Not on file    Family History: Family History  Problem Relation Age of Onset   Asthma Mother    Diabetes Maternal Grandmother    Breast cancer Maternal Grandmother    Hypertension Maternal Grandmother    Asthma Maternal Grandmother    Breast cancer Paternal Grandmother     Allergies: No Known Allergies  Medications Prior to Admission  Medication Sig Dispense Refill Last Dose   aspirin EC 81 MG tablet Take 81 mg by mouth daily. Swallow whole.   07/16/2022   ferrous sulfate 324 MG TBEC Take 324 mg by mouth daily  with breakfast.   07/16/2022   Prenatal MV-Min-Fe Fum-FA-DHA (PRENATAL 1 PO) Take by mouth.   07/16/2022     Review of Systems   All systems reviewed and negative except as stated in HPI  Blood pressure 116/74, pulse 95, temperature 98 F (36.7 C), temperature source Oral, resp. rate 17, height '5\' 8"'$  (1.727 m), weight 88.9 kg, last menstrual period 10/13/2021. General appearance: alert and cooperative Lungs: clear to auscultation bilaterally Heart: regular rate Presentation: cephalic Fetal monitoring baseline 140, mod variability, + accel, - decel Dilation: 8.5 Effacement (%): 100 Station: Plus 1 Exam by:: Evonnie Dawes, RN   Prenatal labs: ABO, Rh: O/RH(D) POSITIVE/-- (09/12 1510) Antibody: NO ANTIBODIES DETECTED (09/12 1510) Rubella: 1.51 (09/12 1510) RPR: Non Reactive (12/28 0907)  HBsAg: NON-REACTIVE (09/12 1510)  HIV: Non Reactive (12/28 0907)  GBS:   neg 2  hr Glucola 81/141/109 Genetic screening   declined Anatomy US normal  Prenatal Transfer Tool  Maternal Diabetes: No Genetic Screening: Declined Maternal Ultrasounds/Referrals: Normal Fetal Ultrasounds or other Referrals:  None Maternal Substance Abuse:  No Significant Maternal Medications:  None Significant Maternal Lab Results:  None Number of Prenatal Visits:greater than 3 verified prenatal visits Other Comments:  None  No results found for this or any previous visit (from the past 24 hour(s)).  Patient Active Problem List   Diagnosis Date Noted   Indication for care in labor or delivery 07/16/2022   History of postpartum hemorrhage 04/03/2022   Supervision of normal pregnancy 12/24/2021   Anemia 06/24/2018   Hx gestational diabetes 05/18/2018    Assessment/Plan:  Lauren Patel is a 27 y.o. G3P2002 at 74w3dhere for labor  #Labor:expectant management #Pain: Desires epidural now #FWB: Cat 1 tracing so far. Continuous external monitoring post epidural #ID:  GBS neg #MOF: formula #MOC: declines #Circ:  desired  #history of PPH, iron deficiency anemia: on PO iron. Most recent hgb 10.5 from 3 weeks ago. CBC today pending.  Seen and discussed with DMaryagnes Amos CNM. Lauren Patel "KDarlyne Russian M.D. PGY-2 Family Medicine Visiting Resident Faculty Practice 07/16/2022 10:59 AM    Attestation of CNM Supervision of Resident: Evaluation and management procedures were performed by the [facility] Res[facility] under my supervision. I was immediately available for direct supervision, assistance and direction throughout this encounter.  I also confirm that I have verified the information documented in the resident's note, and that I have also personally reperformed the pertinent components of the physical exam and all of the medical decision making activities.  I have also made any necessary editorial changes.  DRenee Patel CNM 07/16/2022 11:20 AM

## 2022-07-16 NOTE — Discharge Summary (Signed)
Postpartum Discharge Summary  Date of Service updated: 07/18/22     Patient Name: Lauren Patel DOB: 1996-03-14 MRN: [id number]  Date of admission: 07/16/2022 Delivery date:07/16/2022  Delivering provider: Renee Harder  Date of discharge: 07/18/2022  Admitting diagnosis: Indication for care in labor or delivery [O75.9] Intrauterine pregnancy: [redacted]w[redacted]d     Secondary diagnosis:  Active Problems:   Anemia   History of postpartum hemorrhage   SVD (spontaneous vaginal delivery)  Additional problems: none    Discharge diagnosis: Term Pregnancy Delivered                                              Post partum procedures: none Augmentation: AROM Complications: None  Hospital course: Onset of Labor With Vaginal Delivery      27 y.o. yo [id number] at [redacted]w[redacted]d was admitted in Active Labor on 07/16/2022. Labor course was complicated by none  Membrane Rupture Time/Date: 11:51 AM ,07/16/2022   Delivery Method:Vaginal, Spontaneous  Episiotomy: None  Lacerations:  2nd degree;Perineal  Patient had an uncomplicated postpartum course.  She is ambulating, tolerating a regular diet, passing flatus, and urinating well. Patient is discharged home in stable condition on 07/18/22.  Newborn Data: Birth date:07/16/2022  Birth time:2:44 PM  Gender:Female  Living status:Living  Apgars:9 ,9  Weight:4250 g   Magnesium Sulfate received: No BMZ received: No Rhophylac:No MMR:No T-DaP:Given prenatally Flu: Yes given prenatally Transfusion:No  Physical exam  Vitals:   07/17/22 0345 07/17/22 1630 07/18/22 0010 07/18/22 0549  BP: 98/66 119/76 100/70 97/62  Pulse: 76 93 87 70  Resp: 16 18 18 18   Temp: 97.7 F (36.5 C) 97.7 F (36.5 C) 98 F (36.7 C) 98 F (36.7 C)  TempSrc: Oral Oral Oral Oral  SpO2: 98% 100% 100%   Weight:      Height:       General: alert, cooperative, and no distress Lochia: appropriate Uterine Fundus: firm Incision: N/A DVT Evaluation: No evidence of DVT seen on  physical exam. Labs: Lab Results  Component Value Date   WBC 13.4 (H) 07/16/2022   HGB 11.9 (L) 07/16/2022   HCT 38.1 07/16/2022   MCV 85.6 07/16/2022   PLT 160 07/16/2022      Latest Ref Rng & Units 03/05/2017    3:55 PM  CMP  Glucose 65 - 99 mg/dL 96   BUN 7 - 20 mg/dL 12   Creatinine 0.50 - 1.00 mg/dL 0.82   Sodium 135 - 146 mmol/L 138   Potassium 3.8 - 5.1 mmol/L 4.0   Chloride 98 - 110 mmol/L 102   CO2 20 - 32 mmol/L 27   Calcium 8.9 - 10.4 mg/dL 8.9   Total Protein 6.3 - 8.2 g/dL 6.9   Total Bilirubin 0.2 - 1.1 mg/dL 0.3   AST 12 - 32 U/L 17   ALT 5 - 32 U/L 7    Edinburgh Score:    07/17/2022   11:20 AM  Edinburgh Postnatal Depression Scale Screening Tool  I have been able to laugh and see the funny side of things. 0  I have looked forward with enjoyment to things. 0  I have blamed myself unnecessarily when things went wrong. 1  I have been anxious or worried for no good reason. 2  I have felt scared or panicky for no good reason. 1  Things have been  getting on top of me. 1  I have been so unhappy that I have had difficulty sleeping. 1  I have felt sad or miserable. 1  I have been so unhappy that I have been crying. 1  The thought of harming myself has occurred to me. 0  Edinburgh Postnatal Depression Scale Total 8     After visit meds:  Allergies as of 07/18/2022   No Known Allergies      Medication List     STOP taking these medications    aspirin EC 81 MG tablet   ferrous sulfate 324 MG Tbec       TAKE these medications    ibuprofen 600 MG tablet Commonly known as: ADVIL Take 1 tablet (600 mg total) by mouth every 6 (six) hours.   PRENATAL 1 PO Take by mouth.         Discharge home in stable condition Infant Feeding: Bottle Infant Disposition:home with mother Discharge instruction: per After Visit Summary and Postpartum booklet. Activity: Advance as tolerated. Pelvic rest for 6 weeks.  Diet: routine diet Future  Appointments: Future Appointments  Date Time Provider [place]  08/13/2022  1:50 PM Janyth Pupa, DO [facility]    Follow up Visit:  [place] [place] [facility] Follow up.   Specialty: Obstetrics and Gynecology Why: In 4-6 weeks for postpartum visit Contact information: [address], [place] [phone]                Message to [facility] sent on 3/14 Please schedule this patient for a In person postpartum visit in 4 weeks with the following provider: Any provider. Additional Postpartum F/U: none  Low risk pregnancy complicated by: anemia Delivery mode:  Vaginal, Spontaneous  Anticipated Birth Control:  undecided   07/18/2022 Fatima Blank, CNM

## 2022-07-16 NOTE — Anesthesia Preprocedure Evaluation (Addendum)
Anesthesia Evaluation  Patient identified by MRN, date of birth, ID band Patient awake    Reviewed: Allergy & Precautions, NPO status , Patient's Chart, lab work & pertinent test results  Airway Mallampati: II  TM Distance: >3 FB Neck ROM: Full    Dental no notable dental hx.    Pulmonary neg pulmonary ROS   Pulmonary exam normal        Cardiovascular negative cardio ROS  Rhythm:Regular Rate:Normal     Neuro/Psych negative neurological ROS  negative psych ROS   GI/Hepatic negative GI ROS, Neg liver ROS,,,  Endo/Other  diabetes, Gestational    Renal/GU negative Renal ROS  negative genitourinary   Musculoskeletal negative musculoskeletal ROS (+)    Abdominal Normal abdominal exam  (+)   Peds  Hematology  (+) Blood dyscrasia, anemia Lab Results      Component                Value               Date                      WBC                      13.4 (H)            07/16/2022                HGB                      11.9 (L)            07/16/2022                HCT                      38.1                07/16/2022                MCV                      85.6                07/16/2022                PLT                      160                 07/16/2022              Anesthesia Other Findings   Reproductive/Obstetrics (+) Pregnancy                             Anesthesia Physical Anesthesia Plan  ASA: 2  Anesthesia Plan: Epidural   Post-op Pain Management:    Induction:   PONV Risk Score and Plan: 2 and Treatment may vary due to age or medical condition  Airway Management Planned: Natural Airway  Additional Equipment: None  Intra-op Plan:   Post-operative Plan:   Informed Consent: I have reviewed the patients History and Physical, chart, labs and discussed the procedure including the risks, benefits and alternatives for the proposed anesthesia with the patient or authorized  representative who has indicated his/her understanding and acceptance.  Dental advisory given  Plan Discussed with:   Anesthesia Plan Comments:        Anesthesia Quick Evaluation

## 2022-07-16 NOTE — Progress Notes (Signed)
Labor Progress Note  Jewel Thull [name] is a 27 y.o. G3P2002 at 64w3dpresented for labor  S: still feeling some back pain and pain in the left groin  O:  BP 117/76   Pulse 85   Temp 98.1 F (36.7 C) (Oral)   Resp 18   Ht '5\' 8"'$  (1.727 m)   Wt 88.9 kg   LMP 10/13/2021 (Exact Date)   BMI 29.80 kg/m  EFM: 120bpm/Moderate variability/ 15x15 accels/ None decels  CVE: Dilation: 10 Dilation Complete Date: 07/16/22 Dilation Complete Time: 1152 Effacement (%): 100 Cervical Position: Middle Station: 0 Presentation: Vertex Exam by:: L[place] rn   A&P: 27y.o. GJK:[id number] for labor as above  #Labor: Progressing well. Has been complete. Pushed a bit with RN.  Plan to allow time to labor down a few more while changing positions - suspect OP #Pain: Epidural #FWB: CAT 1 #GBS negative  Cleta Heatley "KaDarlyne RussianM.D. PGY-2 Family Medicine Visiting Resident Faculty Practice 07/16/2022 1:28 PM

## 2022-07-16 NOTE — Anesthesia Procedure Notes (Signed)
Epidural Patient location during procedure: OB Start time: 07/16/2022 11:20 AM End time: 07/16/2022 11:25 AM  Staffing Anesthesiologist: Darral Dash, DO Performed: anesthesiologist   Preanesthetic Checklist Completed: patient identified, IV checked, site marked, risks and benefits discussed, surgical consent, monitors and equipment checked, pre-op evaluation and timeout performed  Epidural Patient position: sitting Prep: ChloraPrep Patient monitoring: heart rate, continuous pulse ox and blood pressure Approach: midline Location: L4-L5 Injection technique: LOR saline  Needle:  Needle type: Tuohy  Needle gauge: 17 G Needle length: 9 cm Needle insertion depth: 5 cm Catheter type: closed end flexible Catheter size: 20 Guage Catheter at skin depth: 11 cm Test dose: negative and 1.5% lidocaine with Epi 1:200 K  Assessment Events: blood not aspirated, no cerebrospinal fluid, injection not painful, no injection resistance and no paresthesia  Additional Notes Patient identified. Risks/Benefits/Options discussed with patient including but not limited to bleeding, infection, nerve damage, paralysis, failed block, incomplete pain control, headache, blood pressure changes, nausea, vomiting, reactions to medications, itching and postpartum back pain. Confirmed with bedside nurse the patient's most recent platelet count. Confirmed with patient that they are not currently taking any anticoagulation, have any bleeding history or any family history of bleeding disorders. Patient expressed understanding and wished to proceed. All questions were answered. Sterile technique was used throughout the entire procedure. Please see nursing notes for vital signs. Test dose was given through epidural catheter and negative prior to continuing to dose epidural or start infusion. Warning signs of high block given to the patient including shortness of breath, tingling/numbness in hands, complete motor block,  or any concerning symptoms with instructions to call for help. Patient was given instructions on fall risk and not to get out of bed. All questions and concerns addressed with instructions to call with any issues or inadequate analgesia.    Reason for block:procedure for pain

## 2022-07-17 ENCOUNTER — Encounter: Payer: Self-pay | Admitting: Obstetrics and Gynecology

## 2022-07-17 NOTE — Lactation Note (Signed)
This note was copied from a baby's chart.  NICU Lactation Consultation Note  Patient Name: Lauren Patel [id number] Date: 07/17/2022 Age:27 years  Reason for consult: Initial assessment; NICU baby; Term  Subjective Spoke to [facility] RN Maudie Mercury and she confirmed that mother's feeding choice after NICU admission is to do formula only; baby currently on Similac 20 calorie formula. Lactation services are completed at this time.  Objective Infant data: Mother's Current Feeding Choice: Formula  Infant feeding assessment Scale for Readiness: 4  Maternal data: G3P3003  Vaginal, Spontaneous  Assessment Infant: Feeding Status: (S) Scheduled 9-12-3-6 (per order, for poor feeding on adlib trial)  Intervention/Plan Plan: Consult Status: Complete   Raynor Calcaterra S Watson Robarge 07/17/2022, 4:56 PM

## 2022-07-17 NOTE — Anesthesia Postprocedure Evaluation (Signed)
Anesthesia Post Note  Patient: Lauren Patel  Procedure(s) Performed: AN AD HOC LABOR EPIDURAL     Patient location during evaluation: Mother Baby Anesthesia Type: Epidural Level of consciousness: awake and alert Pain management: pain level controlled Vital Signs Assessment: post-procedure vital signs reviewed and stable Respiratory status: spontaneous breathing, nonlabored ventilation and respiratory function stable Cardiovascular status: stable Postop Assessment: no headache, no backache, epidural receding, no apparent nausea or vomiting, patient able to bend at knees, able to ambulate and adequate PO intake Anesthetic complications: no   No notable events documented.  Last Vitals:  Vitals:   07/16/22 2211 07/17/22 0345  BP: (!) 105/58 98/66  Pulse: 84 76  Resp: 16 16  Temp: 36.8 C 36.5 C  SpO2: 98% 98%    Last Pain:  Vitals:   07/17/22 0825  TempSrc:   PainSc: 2    Pain Goal:                Epidural/Spinal Function Cutaneous sensation: Normal sensation (07/17/22 0825), Patient able to flex knees: Yes (07/17/22 0825), Patient able to lift hips off bed: Yes (07/17/22 0825), Back pain beyond tenderness at insertion site: No (07/17/22 0825), Progressively worsening motor and/or sensory loss: No (07/17/22 0825), Bowel and/or bladder incontinence post epidural: No (07/17/22 0825)  France Ravens Hristova

## 2022-07-17 NOTE — Progress Notes (Addendum)
POSTPARTUM PROGRESS NOTE  Post Partum Day 1  Subjective:  Lauren Patel is a 27 y.o. [id number] s/p SVD at [redacted]w[redacted]d.  She reports she is doing well. No acute events overnight. She denies any problems with ambulating, voiding or po intake. Denies nausea or vomiting.  Pain is well controlled.  Lochia is intermittently heavy - nothing on pads then when she sits on a toilet a lot of blood comes out. No lightheadedness or dizziness.  Objective: Blood pressure 98/66, pulse 76, temperature 97.7 F (36.5 C), temperature source Oral, resp. rate 16, height 5\' 8"  (1.727 m), weight 88.9 kg, last menstrual period 10/13/2021, SpO2 98 %, unknown if currently breastfeeding.  Physical Exam:  General: alert, cooperative and no distress Chest: no respiratory distress Heart:regular rate, distal pulses intact Uterine Fundus: firm, 1cm below umbilicus DVT Evaluation: No calf swelling or tenderness Skin: warm, dry  Recent Labs    07/16/22 1103  HGB 11.9*  HCT 38.1    Assessment/Plan: Lauren Patel is a 27 y.o. [id number] s/p SVD at [redacted]w[redacted]d   Anemia  - asymptomatic, history of PPH, previously on PO iron prenatally  - continue PO iron  PPD#1 - Doing well  Routine postpartum care Contraception: declined Feeding: bottle Dispo: Plan for discharge today or tomorrow.   LOS: 1 day    Jiayu "[name], M.D. PGY-2 Family Medicine Visiting Resident Faculty Practice 07/17/2022 8:14 AM   Fellow Attestation  I saw and evaluated the patient, performing the key elements of the service.I  personally performed or re-performed the history, physical exam, and medical decision making activities of this service and have verified that the service and findings are accurately documented in the resident's note. I developed the management plan that is described in the resident's note, and I agree with the content, with my edits above.    Gifford Shave, MD OB Fellow

## 2022-07-18 ENCOUNTER — Other Ambulatory Visit (HOSPITAL_BASED_OUTPATIENT_CLINIC_OR_DEPARTMENT_OTHER): Payer: Self-pay | Admitting: Advanced Practice Midwife

## 2022-07-18 MED ORDER — IBUPROFEN 600 MG PO TABS: 600.0000 mg | ORAL_TABLET | Freq: Four times a day (QID) | ORAL | 0 refills | Status: AC

## 2022-07-18 NOTE — Plan of Care (Signed)
  Problem: Education: Goal: Knowledge of General Education information will improve Description: Including pain rating scale, medication(s)/side effects and non-pharmacologic comfort measures Outcome: Completed/Met   Problem: Clinical Measurements: Goal: Ability to maintain clinical measurements within normal limits will improve Outcome: Completed/Met Goal: Will remain free from infection Outcome: Completed/Met Goal: Diagnostic test results will improve Outcome: Completed/Met Goal: Respiratory complications will improve Outcome: Completed/Met Goal: Cardiovascular complication will be avoided Outcome: Completed/Met   Problem: Activity: Goal: Risk for activity intolerance will decrease Outcome: Completed/Met   Problem: Nutrition: Goal: Adequate nutrition will be maintained Outcome: Completed/Met   Problem: Elimination: Goal: Will not experience complications related to bowel motility Outcome: Completed/Met Goal: Will not experience complications related to urinary retention Outcome: Completed/Met   Problem: Pain Managment: Goal: General experience of comfort will improve Outcome: Completed/Met   Problem: Safety: Goal: Ability to remain free from injury will improve Outcome: Completed/Met   Problem: Skin Integrity: Goal: Risk for impaired skin integrity will decrease Outcome: Completed/Met   Problem: Activity: Goal: Will verbalize the importance of balancing activity with adequate rest periods Outcome: Completed/Met Goal: Ability to tolerate increased activity will improve Outcome: Completed/Met   Problem: Coping: Goal: Ability to identify and utilize available resources and services will improve Outcome: Completed/Met   Problem: Life Cycle: Goal: Chance of risk for complications during the postpartum period will decrease Outcome: Completed/Met   Problem: Role Relationship: Goal: Ability to demonstrate positive interaction with newborn will improve Outcome:  Completed/Met   Problem: Skin Integrity: Goal: Demonstration of wound healing without infection will improve Outcome: Completed/Met

## 2022-07-20 ENCOUNTER — Encounter: Payer: Self-pay | Admitting: Obstetrics and Gynecology

## 2022-07-21 ENCOUNTER — Other Ambulatory Visit: Payer: 59

## 2022-07-21 ENCOUNTER — Encounter: Payer: 59 | Admitting: Obstetrics and Gynecology

## 2022-07-27 ENCOUNTER — Telehealth (HOSPITAL_COMMUNITY): Payer: Self-pay | Admitting: *Deleted

## 2022-07-27 NOTE — Telephone Encounter (Signed)
Attempted hospital discharge follow-up call. Voicemail full and unable to accept a message.  Erline Levine, RN, 07/27/22, [phone]

## 2022-07-29 ENCOUNTER — Encounter: Payer: Self-pay | Admitting: Obstetrics and Gynecology

## 2022-08-11 NOTE — Progress Notes (Unsigned)
Post Partum Visit Note  Lauren Patel is a 27 y.o. G22P3003 female who presents for a postpartum visit. She is 4 weeks postpartum following a normal spontaneous vaginal delivery.  I have fully reviewed the prenatal and intrapartum course. The delivery was at 39.3 gestational weeks.  Anesthesia: epidural. Postpartum course has been unremarkable. Baby is doing well. Baby is feeding by bottle - Similac Alimentum. Bleeding staining only. Bowel function is abnormal: mild constipation . Bladder function is normal. Patient is not sexually active. Contraception method is none. Postpartum depression screening: positive (score 10).   The pregnancy intention screening data noted above was reviewed. Potential methods of contraception were discussed. The patient elected to proceed with pullout/condoms.  She has noted some discomfort with BMs- denies incontinence.   Edinburgh Postnatal Depression Scale - 08/13/22 1301       Edinburgh Postnatal Depression Scale:  In the Past 7 Days   I have been able to laugh and see the funny side of things. 0    I have looked forward with enjoyment to things. 1    I have blamed myself unnecessarily when things went wrong. 1    I have been anxious or worried for no good reason. 2    I have felt scared or panicky for no good reason. 2    Things have been getting on top of me. 0    I have been so unhappy that I have had difficulty sleeping. 1    I have felt sad or miserable. 2    I have been so unhappy that I have been crying. 1    The thought of harming myself has occurred to me. 0    Edinburgh Postnatal Depression Scale Total 10             Health Maintenance Due  Topic Date Due   COVID-19 Vaccine (1) Never done   HPV VACCINES (1 - 2-dose series) Never done   Review of Systems Pertinent items are noted in HPI.  Objective:  Ht 5\' 8"  (1.727 m)   Wt 176 lb (79.8 kg)   Breastfeeding No   BMI 26.76 kg/m    General:  alert, cooperative, and no  distress   Breasts:  deferred  Lungs: clear to auscultation bilaterally  Heart:  regular rate and rhythm  Abdomen: Soft and non-tender, no rebound, no guarding    Wound NA  GU exam:   Normal external genitalia, perineal suture present- appropriately tender, no discharge, no erythema.  Cervix visualized- no abnormalities noted.       Assessment:    Postpartum visit  Plan:   Essential components of care per ACOG recommendations:  1.  Mood and well being: Patient with positive depression screening today. Reviewed local resources for support. Pt notes baby is doing great it is managing both the baby and her middle child.  Denies SI-HI.  Encouraged patient to monitor symptoms and let us know should she feel like her symptoms worsen - Patient tobacco use? No.   - hx of drug use? No.    2. Infant care and feeding:  -Patient currently breastmilk feeding? No.    3. Sexuality, contraception and birth spacing - Reviewed reproductive life planning. Reviewed contraceptive methods based on pt preferences and effectiveness.  Patient desired Abstinence today.   - Discussed birth spacing of 18 months  4. Sleep and fatigue -Encouraged family/partner/community support of 4 hrs of uninterrupted sleep to help with mood and fatigue  5.  Physical Recovery  - Discussed patients delivery and complications. She describes her labor as good. - Patient had a Vaginal, no problems at delivery. Patient had a 2nd degree laceration. Perineal healing reviewed. Patient expressed understanding - Patient has urinary incontinence? No. - Advised pelvic rest for another 3-4 weeks.  Suture not yet dissolved- reviewed precautions.  If desired return in 3-4wks to repeat exam  6.  Health Maintenance - Last pap smear  Diagnosis  Date Value Ref Range Status  01/13/2022   Final   - Negative for intraepithelial lesion or malignancy (NILM)   -Breast Cancer screening indicated? No.   7. Chronic Disease/Pregnancy Condition  follow up: None  - PCP follow up  Sharon Seller, DO [facility], [facility]

## 2022-08-13 ENCOUNTER — Ambulatory Visit (INDEPENDENT_AMBULATORY_CARE_PROVIDER_SITE_OTHER): Payer: BC Managed Care – PPO | Admitting: Obstetrics & Gynecology

## 2022-08-13 ENCOUNTER — Encounter: Payer: Self-pay | Admitting: *Deleted

## 2022-08-13 ENCOUNTER — Encounter: Payer: Self-pay | Admitting: Obstetrics & Gynecology

## 2024-02-22 ENCOUNTER — Encounter: Payer: Self-pay | Admitting: Obstetrics and Gynecology

## 2024-02-23 ENCOUNTER — Ambulatory Visit

## 2024-02-23 VITALS — BP 111/75 | HR 86 | Ht 68.0 in | Wt 177.0 lb

## 2024-02-23 DIAGNOSIS — Z3201 Encounter for pregnancy test, result positive: Secondary | ICD-10-CM

## 2024-02-23 LAB — POCT URINE PREGNANCY: Preg Test, Ur: POSITIVE — AB

## 2024-02-23 NOTE — Progress Notes (Signed)
 Lauren Patel here for a UPT. Pt had a positive upt at home. LMP is 01/19/2024.     UPT in office Positive.    Reviewed medications and informed to start a PNV, if not already. Pt to follow up in 6+ weeks for New OB visit. Patient given safe otc medications during pregnancy.   New OB Intake  I explained I am completing New OB Intake today. We discussed EDD of 10/25/2024, by Last Menstrual Period. Pt is G4P3003. I reviewed her allergies, medications and Medical/Surgical/OB history.    Patient Active Problem List   Diagnosis Date Noted   SVD (spontaneous vaginal delivery) 07/16/2022   History of postpartum hemorrhage 04/03/2022   Supervision of normal pregnancy 12/24/2021   Anemia 06/24/2018   Hx gestational diabetes 05/18/2018    Concerns addressed today  Delivery Plans Plans to deliver at [facility]. Discussed the nature of our practice with multiple providers including residents and students. Due to the size of the practice, the delivering provider may not be the same as those providing prenatal care.   MyChart/Babyscripts MyChart access verified. I explained pt will have some visits in office and some virtually. Babyscripts app discussed.  Blood Pressure Cuff Blood pressure cuff discussed. Discussed to be used for virtual visits and or if needed BP checks weekly.  Anatomy US  Explained first scheduled US  will be between 8-10 weeks, anatomy ultrasound at 18-20 weeks.  Last Pap Diagnosis  Date Value Ref Range Status  01/13/2022   Final   - Negative for intraepithelial lesion or malignancy (NILM)    First visit review I reviewed new OB appt with patient. Explained pt will be seen by Dr. Erik at first visit. Discussed [place] genetic screening with patient . Routine prenatal labs to be completed at new OB appointment. RN reviewed when to notify provider and or go to MAU.     Silvano ORN [place], [place] 02/23/2024  4:48 PM

## 2024-02-29 ENCOUNTER — Encounter: Payer: Self-pay | Admitting: Obstetrics & Gynecology

## 2024-02-29 ENCOUNTER — Emergency Department (HOSPITAL_BASED_OUTPATIENT_CLINIC_OR_DEPARTMENT_OTHER)
Admission: EM | Admit: 2024-02-29 | Discharge: 2024-02-29 | Disposition: A | Discharge status: Home / Self Care | Attending: Emergency Medicine | Admitting: Emergency Medicine

## 2024-02-29 ENCOUNTER — Telehealth: Payer: Self-pay

## 2024-02-29 ENCOUNTER — Emergency Department (HOSPITAL_BASED_OUTPATIENT_CLINIC_OR_DEPARTMENT_OTHER)

## 2024-02-29 ENCOUNTER — Other Ambulatory Visit: Payer: Self-pay

## 2024-02-29 DIAGNOSIS — O209 Hemorrhage in early pregnancy, unspecified: Secondary | ICD-10-CM | POA: Insufficient documentation

## 2024-02-29 DIAGNOSIS — O4690 Antepartum hemorrhage, unspecified, unspecified trimester: Secondary | ICD-10-CM

## 2024-02-29 DIAGNOSIS — Z3A01 Less than 8 weeks gestation of pregnancy: Secondary | ICD-10-CM | POA: Insufficient documentation

## 2024-02-29 DIAGNOSIS — O3680X0 Pregnancy with inconclusive fetal viability, not applicable or unspecified: Secondary | ICD-10-CM | POA: Insufficient documentation

## 2024-02-29 LAB — CBC WITH DIFFERENTIAL/PLATELET
Abs Immature Granulocytes: 0.01 K/uL (ref 0.00–0.07)
Basophils Absolute: 0 K/uL (ref 0.0–0.1)
Basophils Relative: 0 %
Eosinophils Absolute: 0.1 K/uL (ref 0.0–0.5)
Eosinophils Relative: 1 %
HCT: 37.3 % (ref 36.0–46.0)
Hemoglobin: 12 g/dL (ref 12.0–15.0)
Immature Granulocytes: 0 %
Lymphocytes Relative: 26 %
Lymphs Abs: 2.4 K/uL (ref 0.7–4.0)
MCH: 26.4 pg (ref 26.0–34.0)
MCHC: 32.2 g/dL (ref 30.0–36.0)
MCV: 82.2 fL (ref 80.0–100.0)
Monocytes Absolute: 0.5 K/uL (ref 0.1–1.0)
Monocytes Relative: 6 %
Neutro Abs: 6.2 K/uL (ref 1.7–7.7)
Neutrophils Relative %: 67 %
Platelets: 226 K/uL (ref 150–400)
RBC: 4.54 MIL/uL (ref 3.87–5.11)
RDW: 12.9 % (ref 11.5–15.5)
WBC: 9.2 K/uL (ref 4.0–10.5)
nRBC: 0 % (ref 0.0–0.2)

## 2024-02-29 LAB — BASIC METABOLIC PANEL WITH GFR
Anion gap: 13 (ref 5–15)
BUN: 13 mg/dL (ref 6–20)
CO2: 22 mmol/L (ref 22–32)
Calcium: 9.2 mg/dL (ref 8.9–10.3)
Chloride: 102 mmol/L (ref 98–111)
Creatinine, Ser: 0.9 mg/dL (ref 0.44–1.00)
GFR, Estimated: >60 mL/min (ref >60)
Glucose, Bld: 106 mg/dL — ABNORMAL HIGH (ref 70–99)
Potassium: 4.4 mmol/L (ref 3.5–5.1)
Sodium: 138 mmol/L (ref 135–145)

## 2024-02-29 LAB — HCG, QUANTITATIVE, PREGNANCY: hCG, Beta Chain, Quant, S: 457 m[IU]/mL — ABNORMAL HIGH (ref <5)

## 2024-02-29 LAB — URINALYSIS, W/ REFLEX TO CULTURE (INFECTION SUSPECTED)
Bilirubin Urine: NEGATIVE
Glucose, UA: NEGATIVE mg/dL
Ketones, ur: NEGATIVE mg/dL
Leukocytes,Ua: NEGATIVE
Nitrite: NEGATIVE
Protein, ur: NEGATIVE mg/dL
Specific Gravity, Urine: 1.025 (ref 1.005–1.030)
WBC, UA: NONE SEEN WBC/hpf (ref 0–5)
pH: 7 (ref 5.0–8.0)

## 2024-02-29 NOTE — ED Provider Notes (Signed)
 [facility] Provider Note   CSN: [id number] Arrival date & time: 02/29/24  2011     Patient presents with: vaginal bleeding with pregnancy   Lauren Patel is a 28 y.o. female G4, P3 approximately [redacted] weeks pregnant by dates here for evaluation of vaginal bleeding and lower abdominal cramping.  She is followed by [facility].  Yesterday started developing some lower abdominal cramping.  She noted some pink vaginal discharge and today has developed brighter red vaginal bleeding, now turning into darker penny sized clots.  Cramping feels like normal menstrual cramping.  Does not radiate.  No dysuria or hematuria.   HPI     Prior to Admission medications   Medication Sig Start Date End Date Taking? Authorizing Provider  ibuprofen  (ADVIL ) 600 MG tablet Take 1 tablet (600 mg total) by mouth every 6 (six) hours. Patient not taking: Reported on 02/23/2024 07/18/22   Milly Olam LABOR, CNM  Prenatal MV-Min-Fe Fum-FA-DHA (PRENATAL 1 PO) Take by mouth. Patient not taking: Reported on 02/23/2024    [provider]    Allergies: Patient has no known allergies.    Review of Systems  Constitutional: Negative.   HENT: Negative.    Respiratory: Negative.    Cardiovascular: Negative.   Gastrointestinal:  Positive for abdominal pain (lower abd cramping). Negative for constipation, diarrhea, nausea and vomiting.  Genitourinary:  Positive for vaginal bleeding. Negative for difficulty urinating, dysuria, flank pain, frequency, pelvic pain and urgency.  Musculoskeletal: Negative.   Skin: Negative.   Neurological: Negative.   All other systems reviewed and are negative.   Updated Vital Signs BP 127/79 (BP Location: Right Arm)   Pulse 85   Temp 98.4 F (36.9 C) (Oral)   Resp 16   Ht 5' 8 (1.727 m)   Wt 81.2 kg   LMP 01/19/2024   SpO2 99%   BMI 27.22 kg/m   Physical Exam Vitals and nursing note reviewed.   Constitutional:      General: She is not in acute distress.    Appearance: She is well-developed. She is not ill-appearing, toxic-appearing or diaphoretic.  HENT:     Head: Atraumatic.  Eyes:     Pupils: Pupils are equal, round, and reactive to light.  Cardiovascular:     Rate and Rhythm: Normal rate.     Pulses: Normal pulses.     Heart sounds: Normal heart sounds.  Pulmonary:     Effort: Pulmonary effort is normal. No respiratory distress.     Breath sounds: Normal breath sounds.  Abdominal:     General: Bowel sounds are normal. There is no distension.     Palpations: Abdomen is soft.     Tenderness: There is no abdominal tenderness. There is no right CVA tenderness, left CVA tenderness, guarding or rebound.  Musculoskeletal:        General: Normal range of motion.     Cervical back: Normal range of motion.  Skin:    General: Skin is warm and dry.  Neurological:     General: No focal deficit present.     Mental Status: She is alert.  Psychiatric:        Mood and Affect: Mood normal.     (all labs ordered are listed, but only abnormal results are displayed) Labs Reviewed  HCG, QUANTITATIVE, PREGNANCY - Abnormal; Notable for the following components:      Result Value   hCG, Beta Chain, Quant, S 457 (*)  All other components within normal limits  BASIC METABOLIC PANEL WITH GFR - Abnormal; Notable for the following components:   Glucose, Bld 106 (*)    All other components within normal limits  URINALYSIS, W/ REFLEX TO CULTURE (INFECTION SUSPECTED) - Abnormal; Notable for the following components:   Hgb urine dipstick MODERATE (*)    Bacteria, UA RARE (*)    All other components within normal limits  CBC WITH DIFFERENTIAL/PLATELET    EKG: None  Radiology: US  OB LESS THAN 14 WEEKS WITH OB TRANSVAGINAL Result Date: 02/29/2024 CLINICAL DATA:  Vaginal bleeding EXAM: OBSTETRIC <14 WK US  AND TRANSVAGINAL OB US  TECHNIQUE: Both transabdominal and transvaginal  ultrasound examinations were performed for complete evaluation of the gestation as well as the maternal uterus, adnexal regions, and pelvic cul-de-sac. Transvaginal technique was performed to assess early pregnancy. COMPARISON:  None Available. FINDINGS: Intrauterine gestational sac: None Yolk sac:  Not Visualized. Embryo:  Not Visualized. Maternal uterus/adnexae: Ovaries are within normal limits. Right ovary measures 2.1 x 2 x 1.9 cm. Left ovary measures 1.8 by 2 x 1.3 cm. No significant free fluid IMPRESSION: No IUP identified. Findings consistent with pregnancy of unknown location, differential of which includes IUP too early to visualize, recent failed pregnancy, and occult ectopic pregnancy. Recommend trending of HCG with repeat ultrasound as indicated Electronically Signed   By: Luke Bun M.D.   On: 02/29/2024 22:01     Procedures   Medications Ordered in the ED - No data to display  G4, P3 approximately [redacted] weeks pregnant by dates here for evaluation of lower abdominal cramping and vaginal bleeding.  Started yesterday.  Has not had ultrasound to establish IUP as of yet.  Was urged to come here by [facility].  Here she is afebrile, nonseptic, non-ill-appearing.  Will plan on labs, imaging.  I did review her outside medical records.  She does have a positive urine pregnancy test at OB/GYN office on 02/23/24  Labs and imaging personally viewed and interpreted:  CBC without leukocytosis BMP without significant abnormality Quant preg 457 UA negative for infection, does show blood US  without evidence of IUP  Discussed results with patient.  Will have her follow-up with OB/GYN in 48 hours for repeat quantitative blood work.  At this time I low suspicion for ectopic.  Will have her return for any new or worsening symptoms.  Patient blood type O+-does not need RhoGAM  The patient has been appropriately medically screened and/or stabilized in the ED. I have low suspicion for any other  emergent medical condition which would require further screening, evaluation or treatment in the ED or require inpatient management.  Patient is hemodynamically stable and in no acute distress.  Patient able to ambulate in department prior to ED.  Evaluation does not show acute pathology that would require ongoing or additional emergent interventions while in the emergency department or further inpatient treatment.  I have discussed the diagnosis with the patient and answered all questions.  Pain is been managed while in the emergency department and patient has no further complaints prior to discharge.  Patient is comfortable with plan discussed in room and is stable for discharge at this time.  I have discussed strict return precautions for returning to the emergency department.  Patient was encouraged to follow-up with PCP/specialist refer to at discharge.  Medical Decision Making Amount and/or Complexity of Data Reviewed External Data Reviewed: labs, radiology and notes. Labs: ordered. Decision-making details documented in ED Course. Radiology: ordered and independent interpretation performed. Decision-making details documented in ED Course.  Risk OTC drugs. Decision regarding hospitalization. Diagnosis or treatment significantly limited by social determinants of health.       Final diagnoses:  Vaginal bleeding in pregnancy  Pregnancy of unknown anatomic location    ED Discharge Orders     None          Eythan Jayne A, PA-C 02/29/24 2240    Patt Alm Macho, MD 03/01/24 [phone]

## 2024-02-29 NOTE — Discharge Instructions (Addendum)
 It was a pleasure taking care of you here today  As we discussed your quantitative pregnancy test was 457.  Your ultrasound did not show an intrauterine pregnancy this could be due to things such as early pregnancy, ectopic pregnancy, threatened miscarriage.  Recommend following up with your OB/GYN in 48 hours for repeat blood work, return for any new or worsening symptoms

## 2024-02-29 NOTE — Telephone Encounter (Signed)
 RN received voicemail from patient regarding spotting, light cramping. RN attempted to return patient call, left HIPAA compliant voicemail to return RN call. Per chart review, patient also sent mychart messages.  Silvano LELON Piano, RN

## 2024-02-29 NOTE — ED Triage Notes (Addendum)
 Pt reports vaginal bleeding that began last night, now with penny size clots Pt is [redacted] weeks pregnant. Reports cramping and lower back pain, no other symptoms. Pt states bleeding present when wiping. No pads per hour at this time.

## 2024-02-29 NOTE — ED Notes (Signed)
 Patient transported to Ultrasound

## 2024-03-03 ENCOUNTER — Ambulatory Visit

## 2024-03-03 DIAGNOSIS — Z3201 Encounter for pregnancy test, result positive: Secondary | ICD-10-CM

## 2024-03-03 NOTE — Progress Notes (Signed)
 Pt asked about her bleeding and states she is having some bleeding and pain today  Rates Pain today 4/10 Bleeding was heavier yesterday

## 2024-03-04 ENCOUNTER — Ambulatory Visit: Payer: Self-pay | Admitting: Certified Nurse Midwife

## 2024-03-04 DIAGNOSIS — O3680X0 Pregnancy with inconclusive fetal viability, not applicable or unspecified: Secondary | ICD-10-CM

## 2024-03-04 DIAGNOSIS — O209 Hemorrhage in early pregnancy, unspecified: Secondary | ICD-10-CM

## 2024-03-04 LAB — BETA HCG QUANT (REF LAB): hCG Quant: 82 m[IU]/mL

## 2024-03-04 NOTE — Progress Notes (Signed)
 Patient with drop in beta Hcg and bleeding after ED visit for bleeding followig a positve pregnancy test.  Beta Hcg 457 (02/29/24) > 82 (03/03/24). Likely consistent with early SAB. However, as IUP never seen on US , should be followed to 0 per protocol. Patinet to return for beta Hcg in 1 week (approximately 03/10/24). MyChart message sent to patient (attempted phone call but no answer).   1. Pregnancy of unknown anatomic location (Primary) - Beta hCG quant (ref lab); Future  2. Bleeding in early pregnancy - Beta hCG quant (ref lab); Future  Camie Rote, MSN, CNM, RNC-OB Certified Nurse Midwife, [facility] 03/04/2024 3:08 PM

## 2024-03-10 ENCOUNTER — Other Ambulatory Visit: Payer: Self-pay

## 2024-03-10 DIAGNOSIS — O3680X0 Pregnancy with inconclusive fetal viability, not applicable or unspecified: Secondary | ICD-10-CM

## 2024-03-10 DIAGNOSIS — O209 Hemorrhage in early pregnancy, unspecified: Secondary | ICD-10-CM

## 2024-03-11 ENCOUNTER — Ambulatory Visit: Payer: Self-pay | Admitting: Certified Nurse Midwife

## 2024-03-11 DIAGNOSIS — O039 Complete or unspecified spontaneous abortion without complication: Secondary | ICD-10-CM

## 2024-03-11 LAB — BETA HCG QUANT (REF LAB): hCG Quant: 4 m[IU]/mL

## 2024-03-11 NOTE — Progress Notes (Signed)
 Patient with continued decrease in beta hcg from 82 (03/03/24) to 4 (03/10/24). Consistent with SAB.   Patient has follow up with Dr. Erik. MyChart message sent to patient.   Camie Rote, MSN, CNM, RNC-OB Certified Nurse Midwife, [facility] 03/11/2024 5:51 AM

## 2024-03-13 ENCOUNTER — Other Ambulatory Visit

## 2024-03-29 ENCOUNTER — Encounter: Payer: Self-pay | Admitting: Obstetrics and Gynecology

## 2024-03-29 ENCOUNTER — Ambulatory Visit: Admitting: Obstetrics and Gynecology

## 2024-06-09 ENCOUNTER — Encounter: Payer: Self-pay | Admitting: Obstetrics and Gynecology

## 2024-06-09 ENCOUNTER — Emergency Department (HOSPITAL_BASED_OUTPATIENT_CLINIC_OR_DEPARTMENT_OTHER)

## 2024-06-09 ENCOUNTER — Emergency Department (HOSPITAL_BASED_OUTPATIENT_CLINIC_OR_DEPARTMENT_OTHER)
Admission: EM | Admit: 2024-06-09 | Discharge: 2024-06-09 | Disposition: A | Discharge status: Home / Self Care | Source: Home / Self Care | Attending: Emergency Medicine | Admitting: Emergency Medicine

## 2024-06-09 ENCOUNTER — Other Ambulatory Visit: Payer: Self-pay

## 2024-06-09 DIAGNOSIS — S8012XA Contusion of left lower leg, initial encounter: Secondary | ICD-10-CM

## 2024-06-09 LAB — CBC WITH DIFFERENTIAL/PLATELET
Abs Immature Granulocytes: 0.08 10*3/uL — ABNORMAL HIGH (ref 0.00–0.07)
Basophils Absolute: 0.1 10*3/uL (ref 0.0–0.1)
Basophils Relative: 1 %
Eosinophils Absolute: 0.1 10*3/uL (ref 0.0–0.5)
Eosinophils Relative: 1 %
HCT: 36.8 % (ref 36.0–46.0)
Hemoglobin: 11.6 g/dL — ABNORMAL LOW (ref 12.0–15.0)
Immature Granulocytes: 1 %
Lymphocytes Relative: 29 %
Lymphs Abs: 2.7 10*3/uL (ref 0.7–4.0)
MCH: 26 pg (ref 26.0–34.0)
MCHC: 31.5 g/dL (ref 30.0–36.0)
MCV: 82.5 fL (ref 80.0–100.0)
Monocytes Absolute: 0.7 10*3/uL (ref 0.1–1.0)
Monocytes Relative: 7 %
Neutro Abs: 5.7 10*3/uL (ref 1.7–7.7)
Neutrophils Relative %: 61 %
Platelets: 240 10*3/uL (ref 150–400)
RBC: 4.46 MIL/uL (ref 3.87–5.11)
RDW: 12.7 % (ref 11.5–15.5)
WBC: 9.2 10*3/uL (ref 4.0–10.5)
nRBC: 0 % (ref 0.0–0.2)

## 2024-06-09 LAB — COMPREHENSIVE METABOLIC PANEL WITH GFR
ALT: 12 U/L (ref 0–44)
AST: 17 U/L (ref 15–41)
Albumin: 4.5 g/dL (ref 3.5–5.0)
Alkaline Phosphatase: 76 U/L (ref 38–126)
Anion gap: 9 (ref 5–15)
BUN: 18 mg/dL (ref 6–20)
CO2: 26 mmol/L (ref 22–32)
Calcium: 9.3 mg/dL (ref 8.9–10.3)
Chloride: 104 mmol/L (ref 98–111)
Creatinine, Ser: 0.85 mg/dL (ref 0.44–1.00)
GFR, Estimated: >60 mL/min
Glucose, Bld: 96 mg/dL (ref 70–99)
Potassium: 4.2 mmol/L (ref 3.5–5.1)
Sodium: 139 mmol/L (ref 135–145)
Total Bilirubin: 0.3 mg/dL (ref 0.0–1.2)
Total Protein: 7.5 g/dL (ref 6.5–8.1)

## 2024-06-09 LAB — HCG, SERUM, QUALITATIVE: Preg, Serum: NEGATIVE

## 2024-06-09 NOTE — Discharge Instructions (Addendum)
 It was a pleasure taking care of you today.  Based on your history, physical exam, labs, as well as imaging I feel you are safe for discharge.  Today your lab work was reassuring and there was no evidence of blood clot.  I recommend over-the-counter Tylenol  and/or ibuprofen  to help with your symptoms along with ice and elevation.  It is possible that this was a superficial blood vessel that burst and that is why you have the bruising.  Please continue to monitor your symptoms.  If you develop fever, chills, redness, warmth, severe pain or bruising continues to go up your leg, chest pain, shortness of breath, severe pain, or other concerning symptom please return to the emergency department.  I have attached a phone number for a primary care provider which you may call and establish care with. If symptoms worsen recommend follow-up within 48 hours.

## 2024-06-09 NOTE — ED Notes (Signed)
 Patient transported to US 

## 2024-06-09 NOTE — ED Triage Notes (Signed)
 Pt arrives with complaints of left lower leg pain that started around 2300 last night. States that it felt like a stinging pain and a small knot but today noticed bruising in the same area. States that pain radiates up into her thigh. Denies injury or other sx
